# Patient Record
Sex: Male | Born: 1990
Health system: Southern US, Community
[De-identification: ages and names within clinical notes are randomized; demographics above are authoritative.]

## PROBLEM LIST (undated history)

## (undated) DIAGNOSIS — IMO0001 Reserved for inherently not codable concepts without codable children: Secondary | ICD-10-CM

## (undated) DIAGNOSIS — K219 Gastro-esophageal reflux disease without esophagitis: Secondary | ICD-10-CM

---
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
## 2009-12-28 ENCOUNTER — Emergency Department (HOSPITAL_COMMUNITY): Admission: EM | Admit: 2009-12-28 | Discharge: 2009-12-28 | Payer: Self-pay | Admitting: Emergency Medicine

## 2010-08-14 ENCOUNTER — Emergency Department (HOSPITAL_BASED_OUTPATIENT_CLINIC_OR_DEPARTMENT_OTHER): Admission: EM | Admit: 2010-08-14 | Discharge: 2010-08-14 | Payer: Self-pay | Admitting: Emergency Medicine

## 2011-03-16 ENCOUNTER — Emergency Department (HOSPITAL_BASED_OUTPATIENT_CLINIC_OR_DEPARTMENT_OTHER)
Admission: EM | Admit: 2011-03-16 | Discharge: 2011-03-16 | Disposition: A | Discharge status: Home / Self Care | Payer: Self-pay | Attending: Emergency Medicine | Admitting: Emergency Medicine

## 2011-03-16 DIAGNOSIS — R197 Diarrhea, unspecified: Secondary | ICD-10-CM | POA: Insufficient documentation

## 2011-03-16 DIAGNOSIS — F17200 Nicotine dependence, unspecified, uncomplicated: Secondary | ICD-10-CM | POA: Insufficient documentation

## 2011-03-16 DIAGNOSIS — R112 Nausea with vomiting, unspecified: Secondary | ICD-10-CM | POA: Insufficient documentation

## 2013-03-20 ENCOUNTER — Encounter (HOSPITAL_BASED_OUTPATIENT_CLINIC_OR_DEPARTMENT_OTHER): Payer: Self-pay | Admitting: Family Medicine

## 2013-03-20 ENCOUNTER — Emergency Department (HOSPITAL_BASED_OUTPATIENT_CLINIC_OR_DEPARTMENT_OTHER)
Admission: EM | Admit: 2013-03-20 | Discharge: 2013-03-20 | Disposition: A | Discharge status: Home / Self Care | Payer: Self-pay | Attending: Emergency Medicine | Admitting: Emergency Medicine

## 2013-03-20 DIAGNOSIS — K219 Gastro-esophageal reflux disease without esophagitis: Secondary | ICD-10-CM | POA: Insufficient documentation

## 2013-03-20 DIAGNOSIS — F17200 Nicotine dependence, unspecified, uncomplicated: Secondary | ICD-10-CM | POA: Insufficient documentation

## 2013-03-20 HISTORY — DX: Reserved for inherently not codable concepts without codable children: IMO0001

## 2013-03-20 HISTORY — DX: Gastro-esophageal reflux disease without esophagitis: K21.9

## 2013-03-20 LAB — CBC WITH DIFFERENTIAL/PLATELET
Eosinophils Absolute: 0.3 10*3/uL (ref 0.0–0.7)
Eosinophils Relative: 4 % (ref 0–5)
HCT: 44.2 % (ref 39.0–52.0)
MCH: 31.4 pg (ref 26.0–34.0)
MCHC: 35.7 g/dL (ref 30.0–36.0)
Monocytes Relative: 11 % (ref 3–12)
Neutro Abs: 4 10*3/uL (ref 1.7–7.7)
Platelets: 205 10*3/uL (ref 150–400)
RBC: 5.03 MIL/uL (ref 4.22–5.81)
RDW: 12.9 % (ref 11.5–15.5)

## 2013-03-20 LAB — COMPREHENSIVE METABOLIC PANEL
AST: 23 U/L (ref 0–37)
BUN: 11 mg/dL (ref 6–23)
CO2: 26 mEq/L (ref 19–32)
Chloride: 102 mEq/L (ref 96–112)
Creatinine, Ser: 0.9 mg/dL (ref 0.50–1.35)
GFR calc Af Amer: >90 mL/min (ref >90)
Glucose, Bld: 104 mg/dL — ABNORMAL HIGH (ref 70–99)
Potassium: 3.8 mEq/L (ref 3.5–5.1)
Total Protein: 6.4 g/dL (ref 6.0–8.3)

## 2013-03-20 LAB — PATHOLOGIST SMEAR REVIEW: Path Review: REACTIVE

## 2013-03-20 MED ORDER — ONDANSETRON 8 MG PO TBDP
8.0000 mg | ORAL_TABLET | Freq: Once | ORAL | Status: AC
  Administered 2013-03-20: 8 mg via ORAL
  Filled 2013-03-20: qty 1

## 2013-03-20 MED ORDER — PROMETHAZINE HCL 25 MG PO TABS: 25.0000 mg | ORAL_TABLET | Freq: Four times a day (QID) | ORAL | Status: DC | PRN

## 2013-03-20 NOTE — ED Provider Notes (Signed)
History     CSN: [id number]  Arrival date & time 03/20/13  0705   First MD Initiated Contact with Patient 03/20/13 0725      Chief Complaint  Patient presents with  . Nausea    (Consider location/radiation/quality/duration/timing/severity/associated sxs/prior treatment) HPI Comments: Patient presents with complaints of a one week history of feeling nauseated in the mornings and when he tries to eat.  He denies any pain, fever, or dark or bloody stool.  He sometimes wakes with a burning in his mouth.  He has been taking tums and drinking mild with some relief.  No prior surgeries.  Does report a history of reflux.    The history is provided by the patient.    Past Medical History  Diagnosis Date  . Reflux     History reviewed. No pertinent past surgical history.  No family history on file.  History  Substance Use Topics  . Smoking status: Current Every Day Smoker  . Smokeless tobacco: Not on file  . Alcohol Use: Yes      Review of Systems  All other systems reviewed and are negative.    Allergies  Review of patient's allergies indicates no known allergies.  Home Medications  No current outpatient prescriptions on file.  BP 129/85  Pulse 68  Temp(Src) 97.9 F (36.6 C) (Oral)  Resp 16  Ht 6\' 4"  (1.93 m)  Wt 170 lb (77.111 kg)  BMI 20.7 kg/m2  SpO2 100%  Physical Exam  Nursing note and vitals reviewed. Constitutional: He is oriented to person, place, and time. He appears well-developed and well-nourished. No distress.  HENT:  Head: Normocephalic and atraumatic.  Mouth/Throat: Oropharynx is clear and moist.  Neck: Normal range of motion. Neck supple.  Cardiovascular: Normal rate and regular rhythm.   No murmur heard. Pulmonary/Chest: Effort normal and breath sounds normal. No respiratory distress. He has no wheezes.  Abdominal: Soft. Bowel sounds are normal. He exhibits no distension. There is no tenderness.  Musculoskeletal: Normal range of motion.   Lymphadenopathy:    He has no cervical adenopathy.  Neurological: He is alert and oriented to person, place, and time.  Skin: Skin is warm and dry. He is not diaphoretic.    ED Course  Procedures (including critical care time)  Labs Reviewed  CBC WITH DIFFERENTIAL  COMPREHENSIVE METABOLIC PANEL   No results found.   No diagnosis found.    MDM  The labs are unremarkable and the patient appears well.  Suspect GERD, will treat with prilosec and phenergan.  Doubt cholecystits or other acute pathology.  Follow up as needed for any problems or not improving.          Geoffery Lyons, MD 03/20/13 [phone]

## 2013-03-20 NOTE — ED Notes (Signed)
Pt c/o nausea and intermittent vomiting x 1 wk. Pt reports h/o reflux.

## 2014-01-03 ENCOUNTER — Encounter (HOSPITAL_BASED_OUTPATIENT_CLINIC_OR_DEPARTMENT_OTHER): Payer: Self-pay | Admitting: Emergency Medicine

## 2014-01-03 ENCOUNTER — Emergency Department (HOSPITAL_BASED_OUTPATIENT_CLINIC_OR_DEPARTMENT_OTHER)
Admission: EM | Admit: 2014-01-03 | Discharge: 2014-01-03 | Disposition: A | Discharge status: Home / Self Care | Payer: Self-pay | Attending: Emergency Medicine | Admitting: Emergency Medicine

## 2014-01-03 DIAGNOSIS — R531 Weakness: Secondary | ICD-10-CM

## 2014-01-03 DIAGNOSIS — Z8719 Personal history of other diseases of the digestive system: Secondary | ICD-10-CM | POA: Insufficient documentation

## 2014-01-03 DIAGNOSIS — R5381 Other malaise: Secondary | ICD-10-CM | POA: Insufficient documentation

## 2014-01-03 DIAGNOSIS — F17200 Nicotine dependence, unspecified, uncomplicated: Secondary | ICD-10-CM | POA: Insufficient documentation

## 2014-01-03 DIAGNOSIS — R5383 Other fatigue: Secondary | ICD-10-CM

## 2014-01-03 LAB — BASIC METABOLIC PANEL
BUN: 14 mg/dL (ref 6–23)
CALCIUM: 9.6 mg/dL (ref 8.4–10.5)
CO2: 27 meq/L (ref 19–32)
CREATININE: 1 mg/dL (ref 0.50–1.35)
Chloride: 104 mEq/L (ref 96–112)
GFR calc Af Amer: >90 mL/min (ref >90)
Glucose, Bld: 122 mg/dL — ABNORMAL HIGH (ref 70–99)
Potassium: 4.2 mEq/L (ref 3.7–5.3)
Sodium: 142 mEq/L (ref 137–147)

## 2014-01-03 LAB — CBC
HEMATOCRIT: 44.3 % (ref 39.0–52.0)
Hemoglobin: 15 g/dL (ref 13.0–17.0)
MCH: 30.4 pg (ref 26.0–34.0)
MCHC: 33.9 g/dL (ref 30.0–36.0)
MCV: 89.9 fL (ref 78.0–100.0)
PLATELETS: 197 10*3/uL (ref 150–400)
RBC: 4.93 MIL/uL (ref 4.22–5.81)
RDW: 12.7 % (ref 11.5–15.5)
WBC: 9.2 10*3/uL (ref 4.0–10.5)

## 2014-01-03 NOTE — ED Provider Notes (Signed)
CSN: [id number]     Arrival date & time 01/03/14  0905 History   First MD Initiated Contact with Patient 01/03/14 [phone]     Chief Complaint  Patient presents with  . dizzy and fatigue      (Consider location/radiation/quality/duration/timing/severity/associated sxs/prior Treatment) HPI Comments: 57M presents with multiple complaints. His CT scan feeling drained, mentally and physically, for the past week. He denies any fevers, cough, chest pain, belly pain, vomiting, diarrhea. He stated last night he is looking at his phone and looked away and the like his eyes are spinning in circles. He stated some mild dizziness at that time. He then was able to sleep. He reports sleeping well, but he only eats one meal a day normally. 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 History reports occasional alcohol use, roughly every other day. He also smokes marijuana roughly every other day. He states "I don't feel like I'm sick", but he does not understand why he is so tired. He reports intermittent nausea throughout the day without vomiting every day to every other day for the past year.   Patient is a 23 y.o. male presenting with weakness. The history is provided by the patient.  Weakness This is a new problem. The current episode started more than 2 days ago (about 1 week). The problem occurs constantly. The problem has not changed since onset.Pertinent negatives include no chest pain, no abdominal pain, no headaches and no shortness of breath. Nothing aggravates the symptoms. Nothing relieves the symptoms. He has tried nothing for the symptoms. The treatment provided no relief.    Past Medical History  Diagnosis Date  . Reflux    History reviewed. No pertinent past surgical history. History reviewed. No pertinent family history. History  Substance Use Topics  . Smoking status: Current Every Day Smoker  . Smokeless tobacco: Not on file  . Alcohol Use: Yes    Review of Systems   Constitutional: Negative for fever, chills and unexpected weight change.  Respiratory: Negative for shortness of breath.   Cardiovascular: Negative for chest pain.  Gastrointestinal: Negative for abdominal pain.  Neurological: Positive for weakness. Negative for headaches.  All other systems reviewed and are negative.      Allergies  Review of patient's allergies indicates no known allergies.  Home Medications   Current Outpatient Rx  Name  Route  Sig  Dispense  Refill  . promethazine (PHENERGAN) 25 MG tablet   Oral   Take 1 tablet (25 mg total) by mouth every 6 (six) hours as needed for nausea.   12 tablet   1    BP 130/68  Pulse 66  Temp(Src) 97.7 F (36.5 C) (Oral)  Resp 16  Ht 6\' 3"  (1.905 m)  Wt 177 lb (80.287 kg)  BMI 22.12 kg/m2  SpO2 100% Physical Exam  Nursing note and vitals reviewed. Constitutional: He is oriented to person, place, and time. He appears well-developed and well-nourished. No distress.  HENT:  Head: Normocephalic and atraumatic.  Mouth/Throat: No oropharyngeal exudate.  Eyes: EOM are normal. Pupils are equal, round, and reactive to light.  Neck: Normal range of motion. Neck supple.  Cardiovascular: Normal rate and regular rhythm.  Exam reveals no friction rub.   No murmur heard. Pulmonary/Chest: Effort normal and breath sounds normal. No respiratory distress. He has no wheezes. He has no rales.  Abdominal: He exhibits no distension. There is no tenderness. There is no rebound.  Musculoskeletal: Normal range of motion.  He exhibits no edema.  Neurological: He is alert and oriented to person, place, and time.  Skin: He is not diaphoretic.    ED Course  Procedures (including critical care time) Labs Review Labs Reviewed  BASIC METABOLIC PANEL - Abnormal; Notable for the following:    Glucose, Bld 122 (*)    All other components within normal limits  CBC   Imaging Review No results found.  EKG Interpretation   None       MDM    Final diagnoses:  Weakness  Fatigue    23 year old male presents with weakness. It could be related to poor eating habits and/or drug/alcohol use. I believe his dizziness that was related to looking at his phone prior to going to sleep and focusing on a bright screen in a dimly lit room. He has not had any other recurrences this since it happened. His vitals are stable, and his exam is benign at this time. I explained to him he needs PCP followup for further testing. CBC and BMP are normal here. I do not feel he needs further imaging as he has no other systemic symptoms. Patient stable for discharge, given resource guide for followup.    Dagmar HaitWilliam Osiel Stick, MD 01/03/14 1018

## 2014-01-03 NOTE — Discharge Instructions (Signed)
Fatigue °Fatigue is a feeling of tiredness, lack of energy, lack of motivation, or feeling tired all the time. Having enough rest, good nutrition, and reducing stress will normally reduce fatigue. Consult your caregiver if it persists. The nature of your fatigue will help your caregiver to find out its cause. The treatment is based on the cause.  °CAUSES  °There are many causes for fatigue. Most of the time, fatigue can be traced to one or more of your habits or routines. Most causes fit into one or more of three general areas. They are: °Lifestyle problems °· Sleep disturbances. °· Overwork. °· Physical exertion. °· Unhealthy habits. °· Poor eating habits or eating disorders. °· Alcohol and/or drug use . °· Lack of proper nutrition (malnutrition). °Psychological problems °· Stress and/or anxiety problems. °· Depression. °· Grief. °· Boredom. °Medical Problems or Conditions °· Anemia. °· Pregnancy. °· Thyroid gland problems. °· Recovery from major surgery. °· Continuous pain. °· Emphysema or asthma that is not well controlled °· Allergic conditions. °· Diabetes. °· Infections (such as mononucleosis). °· Obesity. °· Sleep disorders, such as sleep apnea. °· Heart failure or other heart-related problems. °· Cancer. °· Kidney disease. °· Liver disease. °· Effects of certain medicines such as antihistamines, cough and cold remedies, prescription pain medicines, heart and blood pressure medicines, drugs used for treatment of cancer, and some antidepressants. °SYMPTOMS  °The symptoms of fatigue include:  °· Lack of energy. °· Lack of drive (motivation). °· Drowsiness. °· Feeling of indifference to the surroundings. °DIAGNOSIS  °The details of how you feel help guide your caregiver in finding out what is causing the fatigue. You will be asked about your present and past health condition. It is important to review all medicines that you take, including prescription and non-prescription items. A thorough exam will be done.  You will be questioned about your feelings, habits, and normal lifestyle. Your caregiver may suggest blood tests, urine tests, or other tests to look for common medical causes of fatigue.  °TREATMENT  °Fatigue is treated by correcting the underlying cause. For example, if you have continuous pain or depression, treating these causes will improve how you feel. Similarly, adjusting the dose of certain medicines will help in reducing fatigue.  °HOME CARE INSTRUCTIONS  °· Try to get the required amount of good sleep every night. °· Eat a healthy and nutritious diet, and drink enough water throughout the day. °· Practice ways of relaxing (including yoga or meditation). °· Exercise regularly. °· Make plans to change situations that cause stress. Act on those plans so that stresses decrease over time. Keep your work and personal routine reasonable. °· Avoid street drugs and minimize use of alcohol. °· Start taking a daily multivitamin after consulting your caregiver. °SEEK MEDICAL CARE IF:  °· You have persistent tiredness, which cannot be accounted for. °· You have fever. °· You have unintentional weight loss. °· You have headaches. °· You have disturbed sleep throughout the night. °· You are feeling sad. °· You have constipation. °· You have dry skin. °· You have gained weight. °· You are taking any new or different medicines that you suspect are causing fatigue. °· You are unable to sleep at night. °· You develop any unusual swelling of your legs or other parts of your body. °SEEK IMMEDIATE MEDICAL CARE IF:  °· You are feeling confused. °· Your vision is blurred. °· You feel faint or pass out. °· You develop severe headache. °· You develop severe abdominal, pelvic, or   back pain. °· You develop chest pain, shortness of breath, or an irregular or fast heartbeat. °· You are unable to pass a normal amount of urine. °· You develop abnormal bleeding such as bleeding from the rectum or you vomit blood. °· You have thoughts  about harming yourself or committing suicide. °· You are worried that you might harm someone else. °MAKE SURE YOU:  °· Understand these instructions. °· Will watch your condition. °· Will get help right away if you are not doing well or get worse. °Document Released: 08/23/2007 Document Revised: 01/18/2012 Document Reviewed: 08/23/2007 °ExitCare® Patient Information ©2014 [employer]. ° ° ° °Emergency Department Resource Guide °1) Find a Doctor and Pay Out of Pocket °Although you won't have to find out who is covered by your insurance plan, it is a good idea to ask around and get recommendations. You will then need to call the office and see if the doctor you have chosen will accept you as a new patient and what types of options they offer for patients who are self-pay. Some doctors offer discounts or will set up payment plans for their patients who do not have insurance, but you will need to ask so you aren't surprised when you get to your appointment. ° °2) Contact Your Local Health Department °Not all health departments have doctors that can see patients for sick visits, but many do, so it is worth a call to see if yours does. If you don't know where your local health department is, you can check in your phone book. The CDC also has a tool to help you locate your state's health department, and many state websites also have listings of all of their local health departments. ° °3) Find a Walk-in Clinic °If your illness is not likely to be very severe or complicated, you may want to try a walk in clinic. These are popping up all over the country in pharmacies, drugstores, and shopping centers. They're usually staffed by nurse practitioners or physician assistants that have been trained to treat common illnesses and complaints. They're usually fairly quick and inexpensive. However, if you have serious medical issues or chronic medical problems, these are probably not your best option. ° °No Primary Care  Doctor: °- Call [facility] at  832-8000 - they can help you locate a primary care doctor that  accepts your insurance, provides certain services, etc. °- Physician Referral Service- 1-800-533-3463 ° °Chronic Pain Problems: °Organization         Address  Phone   Notes  °[facility]  (336) 297-2271 Patients need to be referred by their primary care doctor.  ° °Medication Assistance: °Organization         Address  Phone   Notes  °[place] [facility] [address] °[place], [place] [address] (336) 641-8030 --Must be a resident of [place] °-- Must have NO insurance coverage whatsoever (no Medicaid/ Medicare, etc.) °-- The pt. MUST have a primary care doctor that directs their care regularly and follows them in the community °  °[facility]  (866) 331-1348   °[employer]  (888) 892-1162   ° °Agencies that provide inexpensive medical care: °Organization         Address  Phone   Notes  °[facility]  (336) 832-8035   °[facility]    (336) 832-7272   °[facility] [address] °[place], [place] [address] (336) 832-4777   °  [facility] [address], °[place] (336) 271-4999   °[facility]    (336) 373-0678   °[facility]    (336) 272-1050   °[facility] ° [address], [place] Phone:  (336) 832-4444, Fax:  (336) 832-4440 Hours of Operation:  9 am - 6 pm, M-F.  Also accepts Medicaid/Medicare and self-pay.  °[facility] ° [address], [place] Phone: (336) 832-3150, Fax: (336) 832-3151. Hours of Operation:  8:30 am - 5:30 pm, M-F.  Also accepts Medicaid and self-pay.  °[facility] [place] [address], [place] Phone: (336) 878-6027   °[facility] [address], [place], [place] (336)723-1848, Ext. 123 Mondays & Thursdays: 7-9 AM.  First 15 patients are seen on a first  come, first serve basis. °  ° °Medicaid-accepting [place] Providers: ° °Organization         Address  Phone   Notes  °[facility] [address], [place] (336) 641-2100 Also accepts self-pay patients.  °[facility] [address], [place] ° (336) 856-9996   °[facility] [address], [place] (336) 288-8857   °[facility] [address] [place] [address], [place] (336) 299-7000   °Veita Bland [address], [place]  ° (336) 373-1557 Only accepts  Access Medicaid patients after they have their name applied to their card.  ° °Self-Pay (no insurance) in [place]: ° °Organization         Address  Phone   Notes  °Sickle Cell Patients, [facility] [address], [place] (336) 832-1970   °[facility] [address], [place] (336) 832-4400   °[facility] ° [address], [place] (336) 992-4800   °[facility]/Dr. Osei-Bonsu ° [address] [place] [address], [place] or [address], [place] (336) [phone] Phone number for both [place] and [place] locations is the same.  °[facility] [address], [place] (336) 299-0000   °[facility] [address] [place] [address], [place] or [address] (336) 852-7530 °(336) 878-2260   °[facility] [address],  (336) 350-1642, phone; (336) 294-5005, fax Sees patients 1st and 3rd Saturday of every month.  Must not qualify for public or private insurance (i.e. Medicaid, Medicare, [place] Health Choice, Veterans' Benefits) • Household income should be no more than 200% of the poverty level •The clinic cannot treat you if you are pregnant or think you are pregnant • Sexually transmitted diseases are not treated at the clinic.  ° ° °Dental Care: °Organization          Address  Phone  Notes  °[facility] [address],  (336) 641-6152 Accepts children up to age 21 who are enrolled in Medicaid or [place] Health Choice; pregnant women with a Medicaid card; and children who have applied for Medicaid or [place] Health Choice, but were declined, whose parents can pay a reduced fee at time of service.  °[facility] [place]  [address], [place] (336) 641-7733 Accepts children up to age 21 who are enrolled in Medicaid or [place] Health Choice; pregnant women with a Medicaid card; and   children who have applied for Medicaid or [place] Health Choice, but were declined, whose parents can pay a reduced fee at time of service.  °[facility] ° [address], [place] (336) 641-4533 Patients are seen by appointment only. Walk-ins are not accepted. [facility] will see patients 18 years of age and older. °Monday - Tuesday (8am-5pm) °Most Wednesdays (8:30-5pm) °$30 per visit, cash only  °[facility] ° [address], [place] (336) 641-4533 Patients are seen by appointment only. Walk-ins are not accepted. [facility] will see patients 18 years of age and older. °One Wednesday Evening (Monthly: Volunteer Based).  $30 per visit, cash only  °[facility]  (919) 537-3737 for adults; Children under age 4, call Graduate Pediatric Dentistry at (919) [phone]. Children aged 4-14, please call (919) [phone] to request a pediatric application. ° Dental services are provided in all areas of dental care including fillings, crowns and bridges, complete and partial dentures, implants, gum treatment, root canals, and extractions. Preventive care is also provided. Treatment is provided to both adults and children. °Patients are selected via a lottery and there is often a waiting list. °  °[facility] [address], °[place] ° (336) 763-8833 [email or web address] °  °[facility] [address], [place], [place] (336)723-1848, Ext. 123 Second and Fourth Thursday of each month, opens at 6:30 AM; Clinic ends at 9 AM.  Patients are seen on a first-come first-served basis, and a limited number are seen during each clinic.  ° °[facility] ° [address], [place], [place] (336) 723-7904   Eligibility Requirements °You must have lived in [place], [place], or [place] counties for at least the last three months. °  You cannot be eligible for state or federal sponsored healthcare insurance, including Veterans Administration, Medicaid, or Medicare. °  You generally cannot be eligible for healthcare insurance through your employer.  °  How to apply: °Eligibility screenings are held every Tuesday and Wednesday afternoon from 1:00 pm until 4:00 pm. You do not need an appointment for the interview!  °[facility] [address], [place], [place] 336-631-2330   °[facility]  336-342-8273   °[facility]  336-703-3100   °[facility]  336-570-6415   ° °Behavioral Health Resources in the Community: °Intensive Outpatient Programs °Organization         Address  Phone  Notes  °[facility] [address], [place], [place] 336-878-6098   °[facility] [address], [place], [place] 336-832-9800   °[facility] [address], [place], [place] ° 336-882-2125   °[facility] [address],  °[place], [place] 1-800-853-5163 or 336-641-4981   °Substance Abuse Resources °Organization         Address  Phone  Notes  °[facility]  336-882-2125   °[facility]  336-784-9470   °[facility]  336-285-9073   °[facility]  336-845-3988   °Residential & Outpatient Substance Abuse Program  1-800-659-3381   °Psychological  Services °Organization         Address  Phone  Notes  °[facility]  336- 832-9600   °[facility]  336- 378-7881   °[facility] [address], [place] 1-800-853-5163 or 336-641-4981   ° °Mobile Crisis Teams °Organization           Address  Phone  Notes  °[facility], Mobile Crisis Care Unit  1-877-626-1772   °Assertive °[facility] ° [address] [place], [place] 336-834-9664   °Sharon DeEsch [address] °[place] 336-554-5454   ° °Self-Help/Support Groups °Organization         Address  Phone             Notes  °[facility] - variety of support groups  336- 373-1402 Call for more information  °Narcotics Anonymous (NA), [facility] [address], °[place]  2 meetings at this location  ° °Residential Treatment Programs °Organization         Address  Phone  Notes  °[facility] [address],    °[place]  1-866-801-8205   °[facility] ° [address], [place], [place] 704-293-8524   °[facility] [address], [place] 336-845-3988 Admissions: 8am-3pm M-F  °[facility] [address]    °[place], [place] 336-841-1104   °[facility] [address], [place], [place] 336-379-7146   °[facility] [address]  °[place], [place] 336-285-9073   °[facility] - Intensive Outpatient [address], [place], [place] 336-852-3033   °[facility] [address]  °[place], [place] 1-877-615-2722 or 336-784-9470   °[facility] [address] [place], [place] 336-227-7417 Accepts Medicaid  °[facility] [address]  ° [place] 1-800-659-3381 Substance Abuse/Addiction Treatment  ° °[place] Behavioral Health Resources °Organization         Address  Phone  Notes  °[facility]  (888)  581-9988   °Julie Brannon, PhD [address] [place], [place]   (336) 349-5553 or (336) 951-0000   °[facility]   [address] °[place], [place] (336) 349-4454   °[facility] [address], [place], [place] (336) 342-8316 Insurance/Medicaid/sponsorship through [facility]  °[facility] [address]                                    [place], [place] (336) 342-8316 Therapy/tele-psych/case  °[facility] [address]  ° [place], [place] (336) 349-2233    °Dr. Arfeen  (336) 349-4544   °[facility] [place]  [employer] [place] [facility] 1) [address], [place] °2) [address], [place] °3)  [address], [place] (336) 349-3220 °(336) 342-7768 ° °(336) 342-8140   °[place] [facility] (336) 342-1394 or (336) 342-3537 (After Hours)    ° °  °

## 2014-01-03 NOTE — ED Notes (Signed)
Pt concerned that off and on for a year he has been having episodes of feeling mentally fatigued and dizzy . Pt does admit to drinking alcohol at least every other day and smokes marijuana. 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

## 2014-08-27 ENCOUNTER — Emergency Department (HOSPITAL_BASED_OUTPATIENT_CLINIC_OR_DEPARTMENT_OTHER)
Admission: EM | Admit: 2014-08-27 | Discharge: 2014-08-27 | Discharge status: Against Medical Advice | Payer: Self-pay | Attending: Emergency Medicine | Admitting: Emergency Medicine

## 2014-08-27 ENCOUNTER — Emergency Department (HOSPITAL_BASED_OUTPATIENT_CLINIC_OR_DEPARTMENT_OTHER): Payer: Self-pay

## 2014-08-27 ENCOUNTER — Encounter (HOSPITAL_BASED_OUTPATIENT_CLINIC_OR_DEPARTMENT_OTHER): Payer: Self-pay | Admitting: Emergency Medicine

## 2014-08-27 DIAGNOSIS — R05 Cough: Secondary | ICD-10-CM | POA: Insufficient documentation

## 2014-08-27 DIAGNOSIS — R0981 Nasal congestion: Secondary | ICD-10-CM | POA: Insufficient documentation

## 2014-08-27 DIAGNOSIS — Z720 Tobacco use: Secondary | ICD-10-CM | POA: Insufficient documentation

## 2014-08-27 NOTE — ED Notes (Signed)
Pt called to go back to a room, unable to find pt x2

## 2014-08-27 NOTE — ED Notes (Signed)
Reports "sick for 2 weeks"- c/o cough, congestion- no acute distress, resp even and unlabored

## 2014-08-27 NOTE — ED Notes (Signed)
Patient called from room x3, EMT walked around to cafe area and to pharmacy, patient not found.

## 2015-03-04 ENCOUNTER — Emergency Department (HOSPITAL_BASED_OUTPATIENT_CLINIC_OR_DEPARTMENT_OTHER)
Admission: EM | Admit: 2015-03-04 | Discharge: 2015-03-04 | Discharge status: Against Medical Advice | Payer: Self-pay | Attending: Emergency Medicine | Admitting: Emergency Medicine

## 2015-03-04 ENCOUNTER — Encounter (HOSPITAL_BASED_OUTPATIENT_CLINIC_OR_DEPARTMENT_OTHER): Payer: Self-pay | Admitting: *Deleted

## 2015-03-04 DIAGNOSIS — R079 Chest pain, unspecified: Secondary | ICD-10-CM | POA: Insufficient documentation

## 2015-03-04 DIAGNOSIS — Z720 Tobacco use: Secondary | ICD-10-CM | POA: Insufficient documentation

## 2015-03-04 NOTE — ED Notes (Signed)
No answer in lobby.

## 2015-03-04 NOTE — ED Notes (Signed)
Chest pain like a bad pulled muscle. Worse with movement.

## 2017-02-01 ENCOUNTER — Encounter (HOSPITAL_BASED_OUTPATIENT_CLINIC_OR_DEPARTMENT_OTHER): Payer: Self-pay | Admitting: Emergency Medicine

## 2017-02-01 ENCOUNTER — Emergency Department (HOSPITAL_BASED_OUTPATIENT_CLINIC_OR_DEPARTMENT_OTHER)
Admission: EM | Admit: 2017-02-01 | Discharge: 2017-02-01 | Disposition: A | Discharge status: Home / Self Care | Payer: Self-pay | Attending: Emergency Medicine | Admitting: Emergency Medicine

## 2017-02-01 DIAGNOSIS — Y929 Unspecified place or not applicable: Secondary | ICD-10-CM | POA: Insufficient documentation

## 2017-02-01 DIAGNOSIS — Y999 Unspecified external cause status: Secondary | ICD-10-CM | POA: Insufficient documentation

## 2017-02-01 DIAGNOSIS — Y939 Activity, unspecified: Secondary | ICD-10-CM | POA: Insufficient documentation

## 2017-02-01 DIAGNOSIS — S29012A Strain of muscle and tendon of back wall of thorax, initial encounter: Secondary | ICD-10-CM | POA: Insufficient documentation

## 2017-02-01 DIAGNOSIS — M546 Pain in thoracic spine: Secondary | ICD-10-CM

## 2017-02-01 DIAGNOSIS — T148XXA Other injury of unspecified body region, initial encounter: Secondary | ICD-10-CM

## 2017-02-01 DIAGNOSIS — F17210 Nicotine dependence, cigarettes, uncomplicated: Secondary | ICD-10-CM | POA: Insufficient documentation

## 2017-02-01 DIAGNOSIS — X58XXXA Exposure to other specified factors, initial encounter: Secondary | ICD-10-CM | POA: Insufficient documentation

## 2017-02-01 MED ORDER — IBUPROFEN 800 MG PO TABS: 800.0000 mg | ORAL_TABLET | Freq: Four times a day (QID) | ORAL | 0 refills | Status: DC | PRN

## 2017-02-01 MED ORDER — METHOCARBAMOL 500 MG PO TABS: 500.0000 mg | ORAL_TABLET | Freq: Four times a day (QID) | ORAL | 0 refills | Status: DC | PRN

## 2017-02-01 NOTE — ED Triage Notes (Signed)
States," My back is hurting me" Recurrent episodes of back pain for years, was using a ax last week and has pain to left scapula and cervical area

## 2017-02-01 NOTE — Discharge Instructions (Signed)
You have been seen in the Emergency Department (ED)  today for back pain.  Your workup and exam have not shown any acute abnormalities and you are likely suffering from muscle strain or possible problems with your discs, but there is no treatment that will fix your symptoms at this time.  Please take Motrin (ibuprofen) as needed for your pain according to the instructions written on the box.  Alternatively, for the next five days you can take 600mg three times daily with meals (it may upset your stomach). ° °Take Robaxin as prescribed for severe pain. Do not drink alcohol, drive or participate in any other potentially dangerous activities while taking this medication as it may make you sleepy. Do not take this medication with any other sedating medications, either prescription or over-the-counter. If you were prescribed Percocet or Vicodin, do not take these with acetaminophen (Tylenol) as it is already contained within these medications. ° ° °Please follow up with your doctor as soon as possible regarding today's ED visit and your back pain.  Return to the ED for worsening back pain, fever, weakness or numbness of either leg, or if you develop either (1) an inability to urinate or have bowel movements, or (2) loss of your ability to control your bathroom functions (if you start having "accidents"), or if you develop other new symptoms that concern you. ° °

## 2017-02-01 NOTE — ED Provider Notes (Signed)
Emergency Department Provider Note   I have reviewed the triage vital signs and the nursing notes.   HISTORY  Chief Complaint Back Pain   HPI Nicholas Kaiser is a 26 y.o. male with PMH of reflux presents to the emergency department for evaluation of left-sided mid back pain in the setting of chopping wood yesterday. Patient states that shortly after chopping wasn't especially this morning he had severe pain in his back. He describes pain mostly on the left side of his thoracic spine radiating downward slightly. No weakness or numbness. No chest pain or difficulty breathing. Pain is worse with movement or palpation. Scribe's as a cramping pain that is moderate to severe. No alleviating factors.    Past Medical History:  Diagnosis Date  . Reflux     There are no active problems to display for this patient.   History reviewed. No pertinent surgical history.  Current Outpatient Rx  . Order #: [id number] Class: Print  . Order #: [id number] Class: Print  . Order #: [id number] Class: Print    Allergies Patient has no known allergies.  No family history on file.  Social History Social History  Substance Use Topics  . Smoking status: Current Every Day Smoker    Packs/day: 1.00    Types: Cigarettes  . Smokeless tobacco: Never Used  . Alcohol use Yes     Comment: socially    Review of Systems  Constitutional: No fever/chills Eyes: No visual changes. ENT: No sore throat. Cardiovascular: Denies chest pain. Respiratory: Denies shortness of breath. Gastrointestinal: No abdominal pain.  No nausea, no vomiting.  No diarrhea.  No constipation. Genitourinary: Negative for dysuria. Musculoskeletal: Positive thoracic back pain.  Skin: Negative for rash. Neurological: Negative for headaches, focal weakness or numbness.  10-point ROS otherwise negative.  ____________________________________________   PHYSICAL EXAM:  VITAL SIGNS: ED Triage Vitals  Enc Vitals Group     BP  02/01/17 0700 (!) 134/116     Pulse Rate 02/01/17 0700 62     Resp 02/01/17 0700 18     Temp 02/01/17 0700 97.5 F (36.4 C)     Temp Source 02/01/17 0700 Oral     SpO2 02/01/17 0700 98 %     Weight 02/01/17 0700 177 lb (80.3 kg)     Height 02/01/17 0700 6\' 3"  (1.905 m)     Pain Score 02/01/17 0713 4   Constitutional: Alert and oriented. Well appearing and in no acute distress. Eyes: Conjunctivae are normal.  Head: Atraumatic. Nose: No congestion/rhinnorhea. Mouth/Throat: Mucous membranes are moist.  Oropharynx non-erythematous. Neck: No stridor.   Cardiovascular: Normal rate, regular rhythm. Good peripheral circulation. Grossly normal heart sounds.   Respiratory: Normal respiratory effort.  No retractions. Lungs CTAB. Gastrointestinal: Soft and nontender. No distention.  Musculoskeletal: No lower extremity tenderness nor edema. No gross deformities of extremities. Tenderness to palpation over the left thoracic paraspinal musculature. No bruising. No midline spine tenderness.  Neurologic:  Normal speech and language. No gross focal neurologic deficits are appreciated.  Skin:  Skin is warm, dry and intact. No rash noted. Psychiatric: Mood and affect are normal. Speech and behavior are normal.  ____________________________________________   PROCEDURES  Procedure(s) performed:   Procedures  None ____________________________________________   INITIAL IMPRESSION / ASSESSMENT AND PLAN / ED COURSE  Pertinent labs & imaging results that were available during my care of the patient were reviewed by me and considered in my medical decision making (see chart for details).  Patient presents to the emergency  department for evaluation of thoracic back pain in the setting of chopping wood yesterday. No numbness or weakness. No midline spine tenderness. No chest pain. Suspect musculoskeletal etiology with tenderness to palpation of the area history of chopping wood yesterday with pain  starting afterwards. With no focal neurological deficits or concern for cardiovascular disease plan for symptomatic treatment with NSAIDs and muscle relaxer. We'll give work note and advised continued activity/movement.   At this time, I do not feel there is any life-threatening condition present. I have reviewed and discussed all results (EKG, imaging, lab, urine as appropriate), exam findings with patient. I have reviewed nursing notes and appropriate previous records.  I feel the patient is safe to be discharged home without further emergent workup. Discussed usual and customary return precautions. Patient and family (if present) verbalize understanding and are comfortable with this plan.  Patient will follow-up with their primary care provider. If they do not have a primary care provider, information for follow-up has been provided to them. All questions have been answered.  ____________________________________________  FINAL CLINICAL IMPRESSION(S) / ED DIAGNOSES  Final diagnoses:  Muscle strain  Acute left-sided thoracic back pain     MEDICATIONS GIVEN DURING THIS VISIT:  None  NEW OUTPATIENT MEDICATIONS STARTED DURING THIS VISIT:  Discharge Medication List as of 02/01/2017  7:31 AM    START taking these medications   Details  ibuprofen (ADVIL,MOTRIN) 800 MG tablet Take 1 tablet (800 mg total) by mouth every 6 (six) hours as needed., Starting Mon 02/01/2017, Print    methocarbamol (ROBAXIN) 500 MG tablet Take 1 tablet (500 mg total) by mouth every 6 (six) hours as needed for muscle spasms., Starting Mon 02/01/2017, Print          Note:  This document was prepared using Dragon voice recognition software and may include unintentional dictation errors.  Alona BeneJoshua Analysse Quinonez, MD Emergency Medicine   Maia PlanJoshua G Toivo Bordon, MD 02/01/17 [phone]

## 2017-02-01 NOTE — ED Notes (Signed)
Work note given. Pt directed to pharmacy to pick up Rx 

## 2017-03-28 ENCOUNTER — Emergency Department (HOSPITAL_BASED_OUTPATIENT_CLINIC_OR_DEPARTMENT_OTHER): Payer: Self-pay

## 2017-03-28 ENCOUNTER — Emergency Department (HOSPITAL_BASED_OUTPATIENT_CLINIC_OR_DEPARTMENT_OTHER)
Admission: EM | Admit: 2017-03-28 | Discharge: 2017-03-28 | Disposition: A | Discharge status: Home / Self Care | Payer: Self-pay | Attending: Emergency Medicine | Admitting: Emergency Medicine

## 2017-03-28 ENCOUNTER — Encounter (HOSPITAL_BASED_OUTPATIENT_CLINIC_OR_DEPARTMENT_OTHER): Payer: Self-pay | Admitting: *Deleted

## 2017-03-28 DIAGNOSIS — R0789 Other chest pain: Secondary | ICD-10-CM

## 2017-03-28 DIAGNOSIS — R002 Palpitations: Secondary | ICD-10-CM | POA: Insufficient documentation

## 2017-03-28 DIAGNOSIS — R079 Chest pain, unspecified: Secondary | ICD-10-CM | POA: Insufficient documentation

## 2017-03-28 DIAGNOSIS — F17210 Nicotine dependence, cigarettes, uncomplicated: Secondary | ICD-10-CM | POA: Insufficient documentation

## 2017-03-28 LAB — BASIC METABOLIC PANEL
Anion gap: 8 (ref 5–15)
BUN: 15 mg/dL (ref 6–20)
CHLORIDE: 105 mmol/L (ref 101–111)
CO2: 25 mmol/L (ref 22–32)
Calcium: 8.9 mg/dL (ref 8.9–10.3)
Creatinine, Ser: 0.86 mg/dL (ref 0.61–1.24)
GFR calc Af Amer: >60 mL/min (ref >60)
GLUCOSE: 103 mg/dL — AB (ref 65–99)
POTASSIUM: 3.6 mmol/L (ref 3.5–5.1)
Sodium: 138 mmol/L (ref 135–145)

## 2017-03-28 LAB — CBC
HEMATOCRIT: 42.4 % (ref 39.0–52.0)
Hemoglobin: 15.4 g/dL (ref 13.0–17.0)
MCH: 32.2 pg (ref 26.0–34.0)
MCHC: 36.3 g/dL — AB (ref 30.0–36.0)
MCV: 88.5 fL (ref 78.0–100.0)
Platelets: 228 10*3/uL (ref 150–400)
RBC: 4.79 MIL/uL (ref 4.22–5.81)
RDW: 12.8 % (ref 11.5–15.5)
WBC: 8.7 10*3/uL (ref 4.0–10.5)

## 2017-03-28 LAB — TROPONIN I: Troponin I: <0.03 ng/mL (ref <0.03)

## 2017-03-28 NOTE — Discharge Instructions (Signed)
Return to the ED with any concerns including difficulty breathing, worsening chest pain, fainting, leg swelling, decreased level of alertness/lethargy, or any other alarming symptoms

## 2017-03-28 NOTE — ED Triage Notes (Signed)
Pt is here for re-evaluation of CP that began over a month ago.  Pt was worked up for this at [facility] and the results were all negative.  Pt states that the pain comes and goes and is like a pinching and a pressure in his chest. Pt states that he sometimes feels light headed when this occurs and at times he wonders if it could be anxiety.

## 2017-03-28 NOTE — ED Notes (Signed)
ED Provider at bedside. 

## 2017-03-28 NOTE — ED Provider Notes (Signed)
[facility]   CSN: [id number] Arrival date & time: 03/28/17  1939  By signing my name below, I, Modena Jansky, attest that this documentation has been prepared under the direction and in the presence of Jerelyn Scott, MD. Electronically Signed: Modena Jansky, Scribe. 03/28/2017. 9:29 PM.  History   Chief Complaint Chief Complaint  Patient presents with  . Chest Pain   The history is provided by the patient. No language interpreter was used.  Chest Pain   This is a new problem. The current episode started more than 1 week ago. The problem occurs daily. The problem has not changed since onset.The pain is associated with rest. The pain is present in the substernal region and lateral region. The pain is moderate. The quality of the pain is described as pressure-like. The pain does not radiate. Associated symptoms include palpitations. Pertinent negatives include no fever. He has tried nothing for the symptoms. Risk factors include smoking/tobacco exposure and male gender.  Pertinent negatives for family medical history include: no heart disease, no early MI and no sudden death.   HPI Comments: Nicholas Kaiser is a 26 y.o. male with a PMHx of reflux who presents to the Emergency Department complaining of intermittent moderate generalized chest pain that started about a month ago. He has been evaluated for the same complaint at [facility]. He states his pain and heart palpitations ("heart racing") come on while he at rest. His daily episodes last seconds or sometimes longer. He describes the pain as a pressure sensation. He admits to a hx of smoking. Denies any recent surgery/hospitalization, recent long trip, family hx of sudden cardiac death, or other complaints at this time.  He feels anxious with the pain and his hands get very clammy.  No nausea, no diaphoresis. No syncope.    Past Medical History:  Diagnosis Date  . Reflux     There are no active  problems to display for this patient.   History reviewed. No pertinent surgical history.     Home Medications    Prior to Admission medications   Medication Sig Start Date End Date Taking? Authorizing Provider  ibuprofen (ADVIL,MOTRIN) 800 MG tablet Take 1 tablet (800 mg total) by mouth every 6 (six) hours as needed. 02/01/17   Long, Arlyss Repress, MD  methocarbamol (ROBAXIN) 500 MG tablet Take 1 tablet (500 mg total) by mouth every 6 (six) hours as needed for muscle spasms. 02/01/17   Long, Arlyss Repress, MD  promethazine (PHENERGAN) 25 MG tablet Take 1 tablet (25 mg total) by mouth every 6 (six) hours as needed for nausea. 03/20/13   Geoffery Lyons, MD    Family History No family history on file.  Social History Social History  Substance Use Topics  . Smoking status: Current Every Day Smoker    Packs/day: 1.00    Types: Cigarettes  . Smokeless tobacco: Never Used  . Alcohol use Yes     Comment: socially     Allergies   Patient has no known allergies.   Review of Systems Review of Systems  Constitutional: Negative for fever.  Cardiovascular: Positive for chest pain and palpitations.  All other systems reviewed and are negative.    Physical Exam Updated Vital Signs BP (!) 144/86   Pulse 80   Temp 98.4 F (36.9 C) (Oral)   Resp 16   Wt 175 lb (79.4 kg)   SpO2 99%   BMI 21.87 kg/m  Vitals reviewed Physical Exam Physical  Examination: General appearance - alert, well appearing, and in no distress Mental status - alert, oriented to person, place, and time Eyes - no conjunctival injection, no scleral icterus Mouth - mucous membranes moist, pharynx normal without lesions Chest - clear to auscultation, no wheezes, rales or rhonchi, symmetric air entry Heart - normal rate, regular rhythm, normal S1, S2, no murmurs, rubs, clicks or gallops Abdomen - soft, nontender, nondistended, no masses or organomegaly Neurological - alert, oriented, normal speech Extremities - peripheral  pulses normal, no pedal edema, no clubbing or cyanosis Skin - normal coloration and turgor, no rashes  ED Treatments / Results  DIAGNOSTIC STUDIES: Oxygen Saturation is 99% on RA, normal by my interpretation.    COORDINATION OF CARE: 9:33 PM- Pt advised of plan for treatment and pt agrees.  Labs (all labs ordered are listed, but only abnormal results are displayed) Labs Reviewed  BASIC METABOLIC PANEL - Abnormal; Notable for the following:       Result Value   Glucose, Bld 103 (*)    All other components within normal limits  CBC - Abnormal; Notable for the following:    MCHC 36.3 (*)    All other components within normal limits  TROPONIN I    EKG  EKG Interpretation  Date/Time:  Sunday Mar 28 2017 19:47:26 EDT Ventricular Rate:  83 PR Interval:  166 QRS Duration: 96 QT Interval:  356 QTC Calculation: 418 R Axis:   88 Text Interpretation:  Normal sinus rhythm Normal ECG No significant change since last tracing Confirmed by Jerelyn ScottLinker, Lafe Clerk [phone]) on 03/28/2017 7:50:29 PM       Radiology Dg Chest 2 View  Result Date: 03/28/2017 CLINICAL DATA:  Chest pain beginning 1 month ago. EXAM: CHEST  2 VIEW COMPARISON:  08/27/2014 FINDINGS: The heart size and mediastinal contours are within normal limits. Both lungs are clear. The visualized skeletal structures are unremarkable. IMPRESSION: No active cardiopulmonary disease. Electronically Signed   By: Elberta Fortisaniel  Boyle M.D.   On: 03/28/2017 20:41    Procedures Procedures (including critical care time)  Medications Ordered in ED Medications - No data to display   Initial Impression / Assessment and Plan / ED Course  I have reviewed the triage vital signs and the nursing notes.  Pertinent labs & imaging results that were available during my care of the patient were reviewed by me and considered in my medical decision making (see chart for details).     Pt presenting with c/o ongoing chest pains over the past month associated  with fast heart rate and sweaty palms.  He has a lot of anxiety about his symptoms.  EKG and labs and CXR are reassuring.  He has a low heart score, PERC negative.  Doubt ACS and very low risk for PE.  Discussed the workup that was done today in the ED, recommended cardiology followup for palpitations.  Discussed that some of his symptoms are likley related to anxiety.  Given cardiology referral.  Discharged with strict return precautions.  Pt agreeable with plan.  Final Clinical Impressions(s) / ED Diagnoses   Final diagnoses:  Atypical chest pain  Palpitations    New Prescriptions Discharge Medication List as of 03/28/2017  9:46 PM     I personally performed the services described in this documentation, which was scribed in my presence. The recorded information has been reviewed and is accurate.      Jerelyn ScottLinker, Riti Rollyson, MD 03/31/17 [phone]

## 2017-09-11 ENCOUNTER — Encounter (HOSPITAL_BASED_OUTPATIENT_CLINIC_OR_DEPARTMENT_OTHER): Payer: Self-pay | Admitting: Emergency Medicine

## 2017-09-11 ENCOUNTER — Emergency Department (HOSPITAL_BASED_OUTPATIENT_CLINIC_OR_DEPARTMENT_OTHER)
Admission: EM | Admit: 2017-09-11 | Discharge: 2017-09-11 | Disposition: A | Discharge status: Home / Self Care | Payer: Self-pay | Attending: Emergency Medicine | Admitting: Emergency Medicine

## 2017-09-11 DIAGNOSIS — K029 Dental caries, unspecified: Secondary | ICD-10-CM | POA: Insufficient documentation

## 2017-09-11 DIAGNOSIS — F17210 Nicotine dependence, cigarettes, uncomplicated: Secondary | ICD-10-CM | POA: Insufficient documentation

## 2017-09-11 MED ORDER — BUPIVACAINE-EPINEPHRINE (PF) 0.5% -1:200000 IJ SOLN
1.8000 mL | Freq: Once | INTRAMUSCULAR | Status: AC
  Administered 2017-09-11: 1.8 mL
  Filled 2017-09-11: qty 1.8

## 2017-09-11 MED ORDER — HYDROCODONE-ACETAMINOPHEN 5-325 MG PO TABS: 1.0000 | ORAL_TABLET | ORAL | 0 refills | Status: AC | PRN

## 2017-09-11 MED ORDER — PENICILLIN V POTASSIUM 500 MG PO TABS: 500.0000 mg | ORAL_TABLET | Freq: Four times a day (QID) | ORAL | 0 refills | Status: AC

## 2017-09-11 MED ORDER — PENICILLIN V POTASSIUM 250 MG PO TABS
500.0000 mg | ORAL_TABLET | Freq: Once | ORAL | Status: AC
  Administered 2017-09-11: 500 mg via ORAL
  Filled 2017-09-11: qty 2

## 2017-09-11 NOTE — ED Triage Notes (Signed)
Pt c/o dental pain x 3 days.

## 2017-09-11 NOTE — ED Notes (Signed)
Dental pain left bottom back tooth x 3 days

## 2017-09-11 NOTE — ED Provider Notes (Addendum)
[facility] Provider Note: Lowella Dell, MD, FACEP  CSN: [id number] MRN: [id number] ARRIVAL: 09/11/17 at 0004 ROOM: MH02/MH02   CHIEF COMPLAINT  Dental Pain   HISTORY OF PRESENT ILLNESS  09/11/17 2:50 AM Nicholas Kaiser is a 26 y.o. male with multiple carious teeth.  He is here with a 3-day history of pain associated with his left lower second molar.  He states the pain is severe and he has been unable to sleep.  He has had no relief with ibuprofen, Aleve and/or Tylenol.  There is associated left anterior cervical lymphadenopathy.  Pain is worse with attempted eating or drinking.  Consultation with the [facility] state controlled substances database reveals the patient has received no opioid pain prescriptions in the past year.   Past Medical History:  Diagnosis Date  . Reflux     History reviewed. No pertinent surgical history.  No family history on file.  Social History  Substance Use Topics  . Smoking status: Current Every Day Smoker    Packs/day: 1.00    Types: Cigarettes  . Smokeless tobacco: Never Used  . Alcohol use Yes     Comment: socially    Prior to Admission medications   Not on File    Allergies Patient has no known allergies.   REVIEW OF SYSTEMS  Negative except as noted here or in the History of Present Illness.   PHYSICAL EXAMINATION  Initial Vital Signs Blood pressure (!) 157/96, pulse 82, temperature 97.9 F (36.6 C), temperature source Oral, resp. rate 16, height 6\' 3"  (1.905 m), weight 81.6 kg (180 lb), SpO2 99 %.  Examination General: Well-developed, well-nourished male in no acute distress; appearance consistent with age of record HENT: normocephalic; atraumatic; multiple carious teeth; severely carious left lower second molar with adjacent edema and erythema of the gum Eyes: pupils equal, round and reactive to light; extraocular muscles intact Neck: supple; left anterior cervical lymphadenopathy Heart: regular rate  and rhythm Lungs: clear to auscultation bilaterally Abdomen: soft; nondistended; nontender; bowel sounds present Extremities: No deformity; full range of motion Neurologic: Awake, alert and oriented; motor function intact in all extremities and symmetric; no facial droop Skin: Warm and dry Psychiatric: Appears uncomfortable   RESULTS  Summary of this visit's results, reviewed by myself:   EKG Interpretation  Date/Time:    Ventricular Rate:    PR Interval:    QRS Duration:   QT Interval:    QTC Calculation:   R Axis:     Text Interpretation:        Laboratory Studies: No results found for this or any previous visit (from the past 24 hour(s)). Imaging Studies: No results found.  ED COURSE  Nursing notes and initial vitals signs, including pulse oximetry, reviewed.  Vitals:   09/11/17 0013 09/11/17 0014  BP: (!) 157/96   Pulse: 82   Resp: 16   Temp: 97.9 F (36.6 C)   TempSrc: Oral   SpO2: 99%   Weight:  81.6 kg (180 lb)  Height:  6\' 3"  (1.905 m)    PROCEDURES   DENTAL BLOCK 1.8 milliliters of 0.5% bupivacaine with epinephrine were injected into the buccal fold adjacent to the left lower second molar. The patient tolerated this well and there were no immediate complications. Adequate analgesia was obtained.   ED DIAGNOSES     ICD-10-CM   1. Pain due to dental caries K02.9   2. Dental decay K02.9        Jesslynn Kruck,  MD 09/11/17 0301    Paula LibraMolpus, Jenesis Suchy, MD 09/11/17 [phone]

## 2018-04-25 ENCOUNTER — Emergency Department (HOSPITAL_BASED_OUTPATIENT_CLINIC_OR_DEPARTMENT_OTHER)
Admission: EM | Admit: 2018-04-25 | Discharge: 2018-04-25 | Disposition: A | Discharge status: Home / Self Care | Payer: No Typology Code available for payment source | Attending: Physician Assistant | Admitting: Physician Assistant

## 2018-04-25 ENCOUNTER — Other Ambulatory Visit: Payer: Self-pay

## 2018-04-25 ENCOUNTER — Encounter (HOSPITAL_BASED_OUTPATIENT_CLINIC_OR_DEPARTMENT_OTHER): Payer: Self-pay | Admitting: Emergency Medicine

## 2018-04-25 DIAGNOSIS — F17210 Nicotine dependence, cigarettes, uncomplicated: Secondary | ICD-10-CM | POA: Insufficient documentation

## 2018-04-25 DIAGNOSIS — S81812A Laceration without foreign body, left lower leg, initial encounter: Secondary | ICD-10-CM | POA: Diagnosis not present

## 2018-04-25 DIAGNOSIS — Y9389 Activity, other specified: Secondary | ICD-10-CM | POA: Diagnosis not present

## 2018-04-25 DIAGNOSIS — Z23 Encounter for immunization: Secondary | ICD-10-CM | POA: Diagnosis not present

## 2018-04-25 DIAGNOSIS — W269XXA Contact with unspecified sharp object(s), initial encounter: Secondary | ICD-10-CM | POA: Insufficient documentation

## 2018-04-25 DIAGNOSIS — Y999 Unspecified external cause status: Secondary | ICD-10-CM | POA: Diagnosis not present

## 2018-04-25 DIAGNOSIS — Y929 Unspecified place or not applicable: Secondary | ICD-10-CM | POA: Diagnosis not present

## 2018-04-25 MED ORDER — CEPHALEXIN 500 MG PO CAPS: 500.0000 mg | ORAL_CAPSULE | Freq: Three times a day (TID) | ORAL | 0 refills | Status: AC

## 2018-04-25 MED ORDER — TETANUS-DIPHTH-ACELL PERTUSSIS 5-2.5-18.5 LF-MCG/0.5 IM SUSP
0.5000 mL | Freq: Once | INTRAMUSCULAR | Status: AC
  Administered 2018-04-25: 0.5 mL via INTRAMUSCULAR
  Filled 2018-04-25: qty 0.5
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
## 2018-04-25 NOTE — ED Notes (Signed)
Left knee lac irrigated w/ sterile saline.
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
## 2018-04-25 NOTE — Discharge Instructions (Signed)
It was my pleasure taking care of you today!   Please take all of your antibiotics until finished!  Keep wound clean and dry. Wash multiple times with soap and water.   Return to ER for new or worsening symptoms, any additional concerns.

## 2018-08-28 ENCOUNTER — Emergency Department (HOSPITAL_BASED_OUTPATIENT_CLINIC_OR_DEPARTMENT_OTHER)
Admission: EM | Admit: 2018-08-28 | Discharge: 2018-08-28 | Disposition: A | Discharge status: Home / Self Care | Payer: Self-pay | Attending: Emergency Medicine | Admitting: Emergency Medicine

## 2018-08-28 ENCOUNTER — Emergency Department (HOSPITAL_BASED_OUTPATIENT_CLINIC_OR_DEPARTMENT_OTHER): Payer: Self-pay

## 2018-08-28 ENCOUNTER — Encounter (HOSPITAL_BASED_OUTPATIENT_CLINIC_OR_DEPARTMENT_OTHER): Payer: Self-pay | Admitting: Emergency Medicine

## 2018-08-28 ENCOUNTER — Other Ambulatory Visit: Payer: Self-pay

## 2018-08-28 DIAGNOSIS — R079 Chest pain, unspecified: Secondary | ICD-10-CM | POA: Insufficient documentation

## 2018-08-28 DIAGNOSIS — Z5321 Procedure and treatment not carried out due to patient leaving prior to being seen by health care provider: Secondary | ICD-10-CM | POA: Insufficient documentation

## 2018-08-28 LAB — BASIC METABOLIC PANEL
Anion gap: 9 (ref 5–15)
BUN: 14 mg/dL (ref 6–20)
CO2: 27 mmol/L (ref 22–32)
Calcium: 9.3 mg/dL (ref 8.9–10.3)
Chloride: 101 mmol/L (ref 98–111)
Creatinine, Ser: 0.91 mg/dL (ref 0.61–1.24)
GFR calc Af Amer: >60 mL/min (ref >60)
GLUCOSE: 97 mg/dL (ref 70–99)
Potassium: 4.1 mmol/L (ref 3.5–5.1)
Sodium: 137 mmol/L (ref 135–145)

## 2018-08-28 LAB — CBC
HCT: 45.8 % (ref 39.0–52.0)
Hemoglobin: 15.1 g/dL (ref 13.0–17.0)
MCH: 31 pg (ref 26.0–34.0)
MCHC: 33 g/dL (ref 30.0–36.0)
MCV: 94 fL (ref 80.0–100.0)
NRBC: 0 % (ref 0.0–0.2)
Platelets: 256 10*3/uL (ref 150–400)
RBC: 4.87 MIL/uL (ref 4.22–5.81)
RDW: 12.2 % (ref 11.5–15.5)
WBC: 7.2 10*3/uL (ref 4.0–10.5)

## 2018-08-28 LAB — TROPONIN I

## 2018-08-28 NOTE — ED Triage Notes (Signed)
Intermittent chest pain x 2 weeks. States today he feels "weird" and has been sweating.

## 2018-08-28 NOTE — ED Notes (Signed)
Pt called x 2. Not found in either lobby or outside ED

## 2018-08-28 NOTE — ED Notes (Signed)
Called for room no answer

## 2018-08-29 ENCOUNTER — Other Ambulatory Visit: Payer: Self-pay

## 2018-08-29 ENCOUNTER — Emergency Department (HOSPITAL_BASED_OUTPATIENT_CLINIC_OR_DEPARTMENT_OTHER): Payer: Self-pay

## 2018-08-29 ENCOUNTER — Encounter (HOSPITAL_BASED_OUTPATIENT_CLINIC_OR_DEPARTMENT_OTHER): Payer: Self-pay | Admitting: Emergency Medicine

## 2018-08-29 ENCOUNTER — Emergency Department (HOSPITAL_BASED_OUTPATIENT_CLINIC_OR_DEPARTMENT_OTHER)
Admission: EM | Admit: 2018-08-29 | Discharge: 2018-08-29 | Disposition: A | Discharge status: Home / Self Care | Payer: Self-pay | Attending: Emergency Medicine | Admitting: Emergency Medicine

## 2018-08-29 DIAGNOSIS — Z79899 Other long term (current) drug therapy: Secondary | ICD-10-CM | POA: Insufficient documentation

## 2018-08-29 DIAGNOSIS — R0789 Other chest pain: Secondary | ICD-10-CM | POA: Insufficient documentation

## 2018-08-29 DIAGNOSIS — F17210 Nicotine dependence, cigarettes, uncomplicated: Secondary | ICD-10-CM | POA: Insufficient documentation

## 2018-08-29 LAB — TROPONIN I

## 2018-08-29 NOTE — ED Triage Notes (Addendum)
Reports chest pain for 2 weeks when bending over.  States this pain has now become constant.  Here yesterday for same.  Left AMA after triage.

## 2018-08-29 NOTE — ED Provider Notes (Signed)
[facility] EMERGENCY DEPARTMENT Provider Note   CSN: [id number] Arrival date & time: 08/29/18  0803     History   Chief Complaint Chief Complaint  Patient presents with  . Chest Pain    HPI Nicholas Kaiser is a 27 y.o. male.  27 year old male with history of reflux who presents with chest pain.  Patient states that he has had several weeks of migratory chest pain that was initially intermittent but is now constant.  Pain has been constant since this morning.  He has noticed that it is worse when he bends over as well as when he moves in certain ways.  It is not associated with exertion.  He denies any associated shortness of breath, nausea, vomiting, or leg swelling.  He has had some intermittent sweatiness of his palms.  He denies any recent travel, history of blood clots, or history of cancer.  He smokes at least one pack per day, several beers per day, no illicit drug use.  No change in physical activity recently.  Of note, he checked into the ED last night but left before being seen.  FH negative for heart disease.  The history is provided by the patient.  Chest Pain      Past Medical History:  Diagnosis Date  . Reflux     There are no active problems to display for this patient.   History reviewed. No pertinent surgical history.      Home Medications    Prior to Admission medications   Medication Sig Start Date End Date Taking? Authorizing Provider  cephALEXin (KEFLEX) 500 MG capsule Take 1 capsule (500 mg total) by mouth 3 (three) times daily. 04/25/18   Ward, Chase Picket, PA-C  HYDROcodone-acetaminophen (NORCO) 5-325 MG tablet Take 1 tablet by mouth every 4 (four) hours as needed for severe pain. 09/11/17   Molpus, Jonny Ruiz, MD    Family History History reviewed. No pertinent family history.  Social History Social History   Tobacco Use  . Smoking status: Current Every Day Smoker    Packs/day: 1.00    Types: Cigarettes  . Smokeless tobacco:  Never Used  Substance Use Topics  . Alcohol use: Yes    Comment: socially  . Drug use: Yes    Types: Marijuana    Comment: denies current use     Allergies   Patient has no known allergies.   Review of Systems Review of Systems  Cardiovascular: Positive for chest pain.   All other systems reviewed and are negative except that which was mentioned in HPI   Physical Exam Updated Vital Signs BP 140/87 (BP Location: Left Arm)   Pulse 64   Temp 97.8 F (36.6 C) (Oral)   Resp 20   Ht 6\' 3"  (1.905 m)   Wt 81.6 kg   SpO2 98%   BMI 22.50 kg/m   Physical Exam  Constitutional: He is oriented to person, place, and time. He appears well-developed and well-nourished. No distress.  HENT:  Head: Normocephalic and atraumatic.  Moist mucous membranes  Eyes: Pupils are equal, round, and reactive to light. Conjunctivae are normal.  Neck: Neck supple.  Cardiovascular: Normal rate, regular rhythm and normal heart sounds.  No murmur heard. Pulmonary/Chest: Effort normal and breath sounds normal.  Abdominal: Soft. Bowel sounds are normal. He exhibits no distension. There is no tenderness.  Musculoskeletal: He exhibits no edema.  No chest wall tenderness  Neurological: He is alert and oriented to person, place, and time.  Fluent speech  Skin: Skin is warm and dry.  Psychiatric: He has a normal mood and affect. Judgment normal.  Nursing note and vitals reviewed.    ED Treatments / Results  Labs (all labs ordered are listed, but only abnormal results are displayed) Labs Reviewed  TROPONIN I    EKG None  Sinus rhythm, rate 67, no ST segment or T wave changes, no evidence of ischemia.  Radiology Dg Chest 2 View  Result Date: 08/28/2018 CLINICAL DATA:  Chest pain. EXAM: CHEST - 2 VIEW COMPARISON:  Radiographs of Mar 28, 2017. FINDINGS: The heart size and mediastinal contours are within normal limits. Both lungs are clear. No pneumothorax or pleural effusion is noted. The  visualized skeletal structures are unremarkable. IMPRESSION: No active cardiopulmonary disease. Electronically Signed   By: Lupita Raider, M.D.   On: 08/28/2018 15:28    Procedures Procedures (including critical care time)  Medications Ordered in ED Medications - No data to display   Initial Impression / Assessment and Plan / ED Course  I have reviewed the triage vital signs and the nursing notes.  Pertinent labs & imaging results that were available during my care of the patient were reviewed by me and considered in my medical decision making (see chart for details).     EKG unremarkable.  I reviewed basic lab work and troponin from last night which were unremarkable.  Chest x-ray yesterday was normal.  Obtained second troponin. PERC negative and VS normal therefore doubt PE. HEART score is 1.  Symptoms are very apical for ACS and suggest musculoskeletal etiology given that pain is worse with certain movements.  Discussed supportive measures including scheduled NSAIDs and Tylenol.  Return precautions reviewed.  Patient voiced understanding.  Final Clinical Impressions(s) / ED Diagnoses   Final diagnoses:  None    ED Discharge Orders    None       Little, Ambrose Finland, MD 08/29/18 [phone]

## 2018-08-29 NOTE — ED Notes (Signed)
Patient previously had chest xray and labs done yesterday for same complaint. EDP made aware.  Will obtain after EDP evaluates patient.

## 2018-12-02 DIAGNOSIS — R071 Chest pain on breathing: Secondary | ICD-10-CM | POA: Diagnosis not present

## 2018-12-05 ENCOUNTER — Other Ambulatory Visit: Payer: Self-pay

## 2018-12-05 ENCOUNTER — Emergency Department (HOSPITAL_BASED_OUTPATIENT_CLINIC_OR_DEPARTMENT_OTHER): Payer: BLUE CROSS/BLUE SHIELD

## 2018-12-05 ENCOUNTER — Emergency Department (HOSPITAL_BASED_OUTPATIENT_CLINIC_OR_DEPARTMENT_OTHER)
Admission: EM | Admit: 2018-12-05 | Discharge: 2018-12-05 | Disposition: A | Discharge status: Home / Self Care | Payer: BLUE CROSS/BLUE SHIELD | Attending: Emergency Medicine | Admitting: Emergency Medicine

## 2018-12-05 ENCOUNTER — Encounter (HOSPITAL_BASED_OUTPATIENT_CLINIC_OR_DEPARTMENT_OTHER): Payer: Self-pay | Admitting: *Deleted

## 2018-12-05 DIAGNOSIS — M7918 Myalgia, other site: Secondary | ICD-10-CM | POA: Insufficient documentation

## 2018-12-05 DIAGNOSIS — F17210 Nicotine dependence, cigarettes, uncomplicated: Secondary | ICD-10-CM | POA: Insufficient documentation

## 2018-12-05 DIAGNOSIS — R079 Chest pain, unspecified: Secondary | ICD-10-CM | POA: Diagnosis not present

## 2018-12-05 DIAGNOSIS — M791 Myalgia, unspecified site: Secondary | ICD-10-CM

## 2018-12-05 MED ORDER — METHOCARBAMOL 500 MG PO TABS: 500.0000 mg | ORAL_TABLET | Freq: Two times a day (BID) | ORAL | 0 refills | Status: AC

## 2018-12-05 MED ORDER — METHOCARBAMOL 500 MG PO TABS: 500.0000 mg | ORAL_TABLET | Freq: Two times a day (BID) | ORAL | 0 refills | Status: DC

## 2018-12-05 NOTE — Discharge Instructions (Addendum)
Your xray today was normal, please follow up with your primary care physician as scheduled. Please continue to take medication provided at Urgent Care.

## 2018-12-05 NOTE — ED Provider Notes (Signed)
[facility] EMERGENCY DEPARTMENT Provider Note   CSN: [id number] Arrival date & time: 12/05/18  1814     History   Chief Complaint Chief Complaint  Patient presents with  . Muscle Pain    HPI Nicholas Kaiser is a 28 y.o. male.  28 y.o male with a PMH of Reflux presents to the ED with a chief complaint of muscle pain. Patient was seen in the ED in October and had a negative ACS workup. He was also seen at UC two days ago and reports he was diagnosed with costochondritis, described prednisone, diclofenac for his symptoms but reports he is been taking this medication for the past 2 days without relieving symptoms.  He describes his pain as a dull ache on the left side of his chest right in front of his heart.  Reports the pain is not reproducible with palpation.  Denies any illicit drug use such as cocaine, methamphetamines or IV drug use.  He denies any fever, shortness of breath, abdominal pain or other complaints.      Past Medical History:  Diagnosis Date  . Reflux     There are no active problems to display for this patient.   History reviewed. No pertinent surgical history.      Home Medications    Prior to Admission medications   Medication Sig Start Date End Date Taking? Authorizing Provider  Diclofenac Sodium (VOLTAREN PO) Take by mouth.   Yes [provider]  PREDNISONE PO Take by mouth.   Yes [provider]  cephALEXin (KEFLEX) 500 MG capsule Take 1 capsule (500 mg total) by mouth 3 (three) times daily. 04/25/18   Ward, Chase Picket, PA-C  HYDROcodone-acetaminophen (NORCO) 5-325 MG tablet Take 1 tablet by mouth every 4 (four) hours as needed for severe pain. 09/11/17   Molpus, John, MD  methocarbamol (ROBAXIN) 500 MG tablet Take 1 tablet (500 mg total) by mouth 2 (two) times daily for 7 days. 12/05/18 12/12/18  Claude Manges, PA-C    Family History No family history on file.  Social History Social History   Tobacco Use  . Smoking  status: Current Every Day Smoker    Packs/day: 1.00    Types: Cigarettes  . Smokeless tobacco: Never Used  Substance Use Topics  . Alcohol use: Yes    Comment: socially  . Drug use: Yes    Types: Marijuana    Comment: denies current use     Allergies   Patient has no known allergies.   Review of Systems Review of Systems  Constitutional: Negative for fever.  Cardiovascular: Positive for chest pain.  Musculoskeletal: Positive for myalgias.     Physical Exam Updated Vital Signs BP (!) 145/103   Pulse 91   Temp 98.4 F (36.9 C) (Oral)   Resp 18   Ht 6\' 3"  (1.905 m)   Wt 84.4 kg   SpO2 99%   BMI 23.25 kg/m   Physical Exam Vitals signs and nursing note reviewed.  Constitutional:      Appearance: He is well-developed.     Comments: Overall well-appearing.  HENT:     Head: Normocephalic and atraumatic.  Eyes:     General: No scleral icterus.    Pupils: Pupils are equal, round, and reactive to light.  Neck:     Musculoskeletal: Normal range of motion.  Cardiovascular:     Heart sounds: Normal heart sounds.  Pulmonary:     Effort: Pulmonary effort is normal.  Breath sounds: Normal breath sounds. No wheezing or rhonchi.     Comments: Breath sounds diminished to auscultation. Chest:     Chest wall: No tenderness.     Breasts:        Left: No inverted nipple, mass, nipple discharge, skin change or tenderness.       Comments: describes pain along the left chest. Abdominal:     General: Bowel sounds are normal. There is no distension.     Palpations: Abdomen is soft.     Tenderness: There is no abdominal tenderness.  Musculoskeletal:        General: No tenderness or deformity.  Skin:    General: Skin is warm and dry.  Neurological:     Mental Status: He is alert and oriented to person, place, and time.      ED Treatments / Results  Labs (all labs ordered are listed, but only abnormal results are displayed) Labs Reviewed - No data to  display  EKG None  Radiology Dg Chest 2 View  Result Date: 12/05/2018 CLINICAL DATA:  28 year old male with a history of left-sided chest pain EXAM: CHEST - 2 VIEW COMPARISON:  08/28/2018, 03/28/2017 FINDINGS: The heart size and mediastinal contours are within normal limits. Both lungs are clear. No acute displaced fracture. Mild scoliotic curvature. IMPRESSION: Negative for acute cardiopulmonary disease Electronically Signed   By: Gilmer Mor D.O.   On: 12/05/2018 20:14    Procedures Procedures (including critical care time)  Medications Ordered in ED Medications - No data to display   Initial Impression / Assessment and Plan / ED Course  I have reviewed the triage vital signs and the nursing notes.  Pertinent labs & imaging results that were available during my care of the patient were reviewed by me and considered in my medical decision making (see chart for details).    Patient presents with recurrent chest pain.  In the ED in the month of October with a negative cardiac work-up.  Patient was seen at urgent care 2 days ago and diagnosed with costochondritis, he was placed on prednisone along with diclofenac and reports medication is not helping with symptoms.  Evaluation patient's pain is nonreproducible with palpation, he reports the pain is right in front of heart region.  Denies any illicit drug use, IV drug use, or cocaine.   DG chest xray obtained to r/o any cardiopulmonary disease, negative for this. He reports it is not reproducible with palpation.  At this time patient has had a negative cardiac workup recently he has a scheduled appointment with a PCP on 02.12. Will add muscle relaxer to help with his pain and have him follow up outpatient. No further emergency workup noted. Patient understand and agrees with plan. Return precautions provided at length.   Final Clinical Impressions(s) / ED Diagnoses   Final diagnoses:  Muscle pain    ED Discharge Orders          Ordered    methocarbamol (ROBAXIN) 500 MG tablet  2 times daily     12/05/18 2033           Claude Manges, PA-C 12/05/18 2035    Virgina Norfolk, DO 12/06/18 0100

## 2018-12-05 NOTE — ED Triage Notes (Signed)
Chest pain for a few months states he has been seen 2 times for the pain and has negative test. Back pain. He was seen at [facility] and diagnosed with costochondritis.

## 2018-12-06 MED FILL — METHOCARBAMOL 500 MG TABLET: 500 | 7 days supply | Qty: 14 | Fill #0

## 2018-12-21 DIAGNOSIS — Z6823 Body mass index (BMI) 23.0-23.9, adult: Secondary | ICD-10-CM | POA: Diagnosis not present

## 2018-12-21 DIAGNOSIS — M545 Low back pain: Secondary | ICD-10-CM | POA: Diagnosis not present

## 2018-12-21 DIAGNOSIS — K219 Gastro-esophageal reflux disease without esophagitis: Secondary | ICD-10-CM | POA: Diagnosis not present

## 2019-01-12 DIAGNOSIS — K219 Gastro-esophageal reflux disease without esophagitis: Secondary | ICD-10-CM | POA: Diagnosis not present

## 2019-03-27 DIAGNOSIS — M5416 Radiculopathy, lumbar region: Secondary | ICD-10-CM | POA: Diagnosis not present

## 2019-03-27 DIAGNOSIS — M5137 Other intervertebral disc degeneration, lumbosacral region: Secondary | ICD-10-CM | POA: Diagnosis not present

## 2019-03-27 DIAGNOSIS — M5417 Radiculopathy, lumbosacral region: Secondary | ICD-10-CM | POA: Diagnosis not present

## 2019-03-27 DIAGNOSIS — M9903 Segmental and somatic dysfunction of lumbar region: Secondary | ICD-10-CM | POA: Diagnosis not present

## 2019-03-28 DIAGNOSIS — M5417 Radiculopathy, lumbosacral region: Secondary | ICD-10-CM | POA: Diagnosis not present

## 2019-03-28 DIAGNOSIS — M5137 Other intervertebral disc degeneration, lumbosacral region: Secondary | ICD-10-CM | POA: Diagnosis not present

## 2019-03-28 DIAGNOSIS — M5416 Radiculopathy, lumbar region: Secondary | ICD-10-CM | POA: Diagnosis not present

## 2019-03-28 DIAGNOSIS — M9903 Segmental and somatic dysfunction of lumbar region: Secondary | ICD-10-CM | POA: Diagnosis not present

## 2019-03-30 DIAGNOSIS — M5416 Radiculopathy, lumbar region: Secondary | ICD-10-CM | POA: Diagnosis not present

## 2019-03-30 DIAGNOSIS — M5417 Radiculopathy, lumbosacral region: Secondary | ICD-10-CM | POA: Diagnosis not present

## 2019-03-30 DIAGNOSIS — M9903 Segmental and somatic dysfunction of lumbar region: Secondary | ICD-10-CM | POA: Diagnosis not present

## 2019-03-30 DIAGNOSIS — M5137 Other intervertebral disc degeneration, lumbosacral region: Secondary | ICD-10-CM | POA: Diagnosis not present

## 2019-04-10 DIAGNOSIS — M5137 Other intervertebral disc degeneration, lumbosacral region: Secondary | ICD-10-CM | POA: Diagnosis not present

## 2019-04-10 DIAGNOSIS — M5416 Radiculopathy, lumbar region: Secondary | ICD-10-CM | POA: Diagnosis not present

## 2019-04-10 DIAGNOSIS — M5417 Radiculopathy, lumbosacral region: Secondary | ICD-10-CM | POA: Diagnosis not present

## 2019-04-10 DIAGNOSIS — M9903 Segmental and somatic dysfunction of lumbar region: Secondary | ICD-10-CM | POA: Diagnosis not present

## 2019-04-11 DIAGNOSIS — M5137 Other intervertebral disc degeneration, lumbosacral region: Secondary | ICD-10-CM | POA: Diagnosis not present

## 2019-04-11 DIAGNOSIS — M5416 Radiculopathy, lumbar region: Secondary | ICD-10-CM | POA: Diagnosis not present

## 2019-04-11 DIAGNOSIS — M9903 Segmental and somatic dysfunction of lumbar region: Secondary | ICD-10-CM | POA: Diagnosis not present

## 2019-04-11 DIAGNOSIS — M5417 Radiculopathy, lumbosacral region: Secondary | ICD-10-CM | POA: Diagnosis not present

## 2019-10-12 DIAGNOSIS — R05 Cough: Secondary | ICD-10-CM | POA: Diagnosis not present

## 2019-10-12 DIAGNOSIS — Z20828 Contact with and (suspected) exposure to other viral communicable diseases: Secondary | ICD-10-CM | POA: Diagnosis not present

## 2020-06-29 IMAGING — DX DG CHEST 2V
2 series · 2 of 2 positions shown · non-contrast
Comparison: 08/28/2018, 03/28/2017

CLINICAL DATA: 27-year-old male with a history of left-sided chest
pain

EXAM:
CHEST - 2 VIEW

[chest pa]
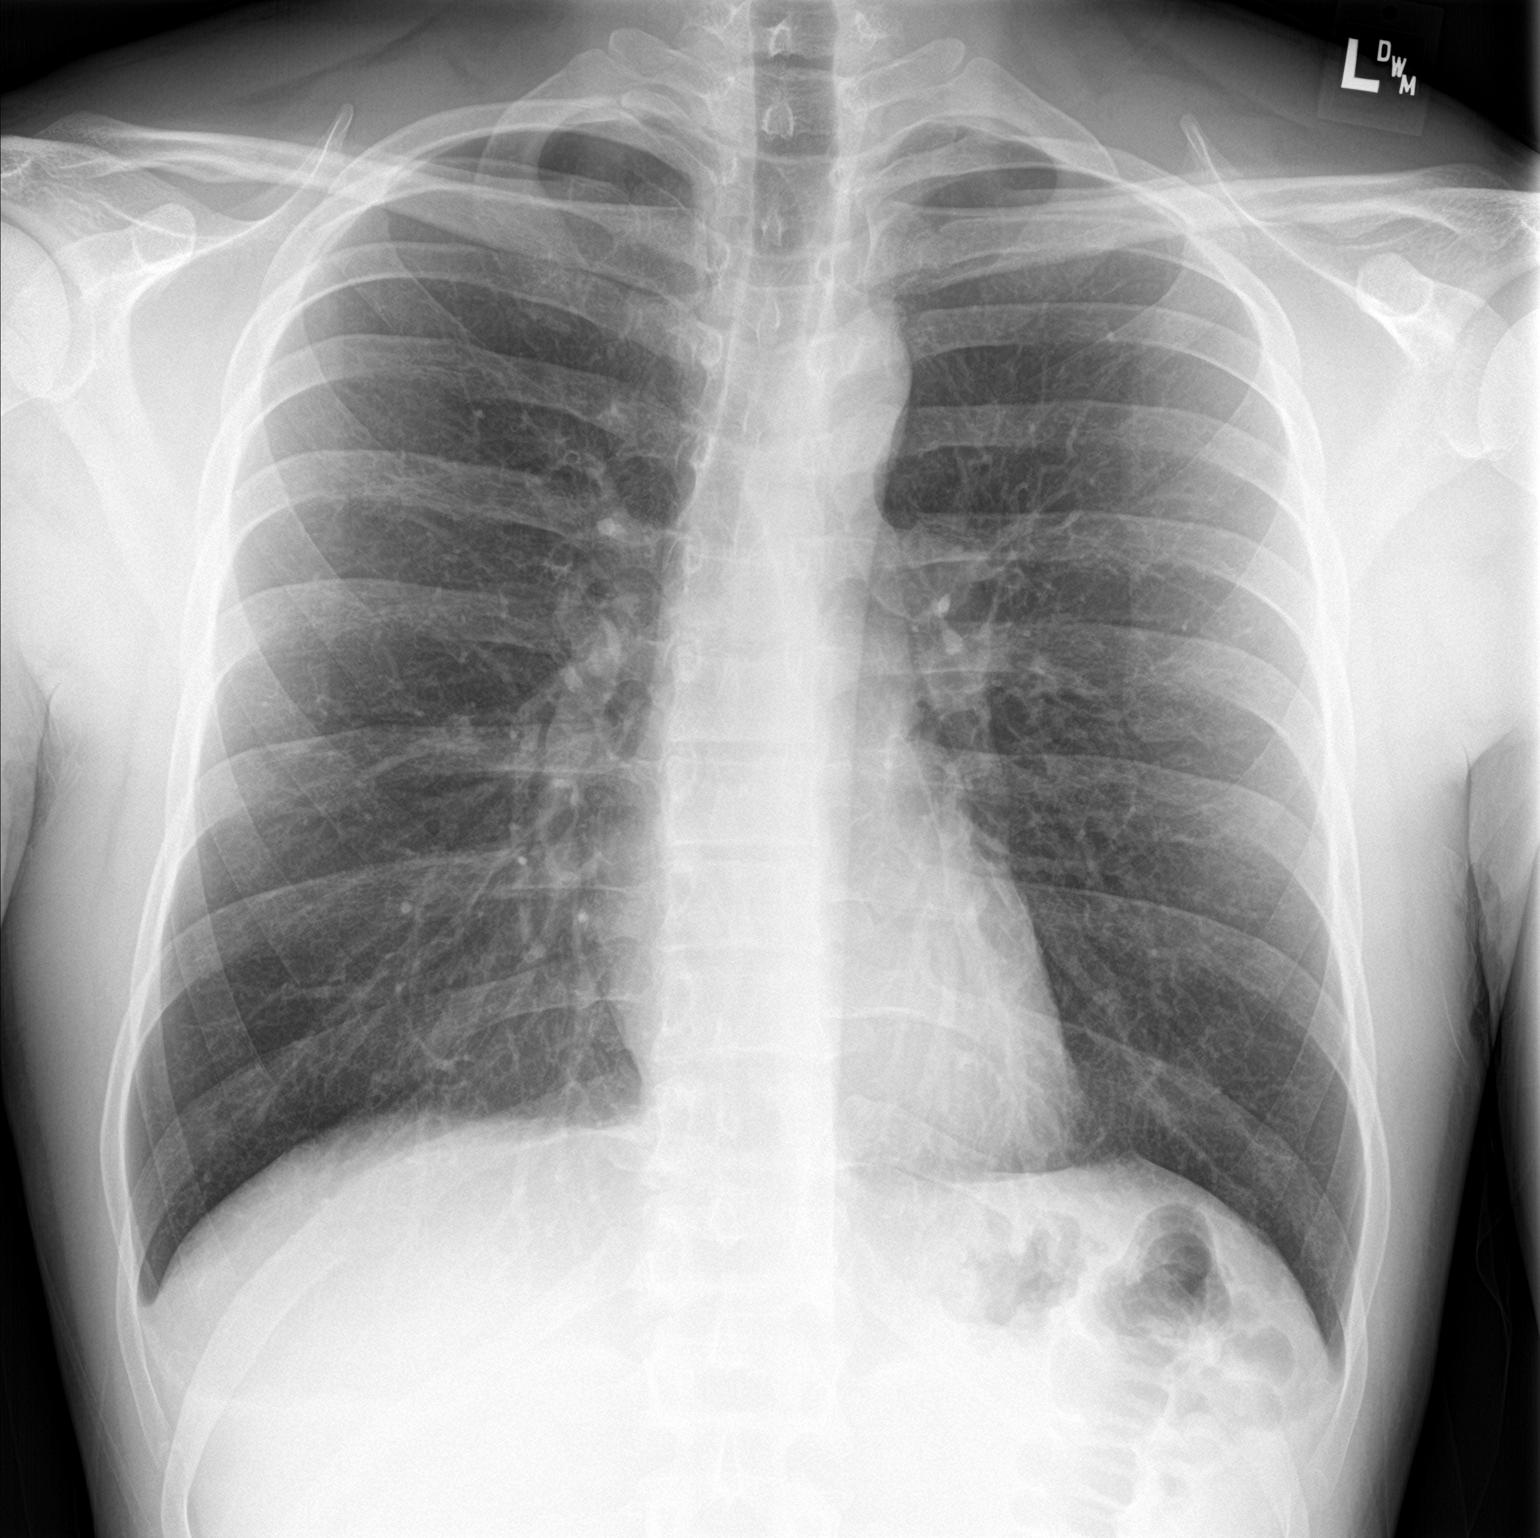

[chest lat]
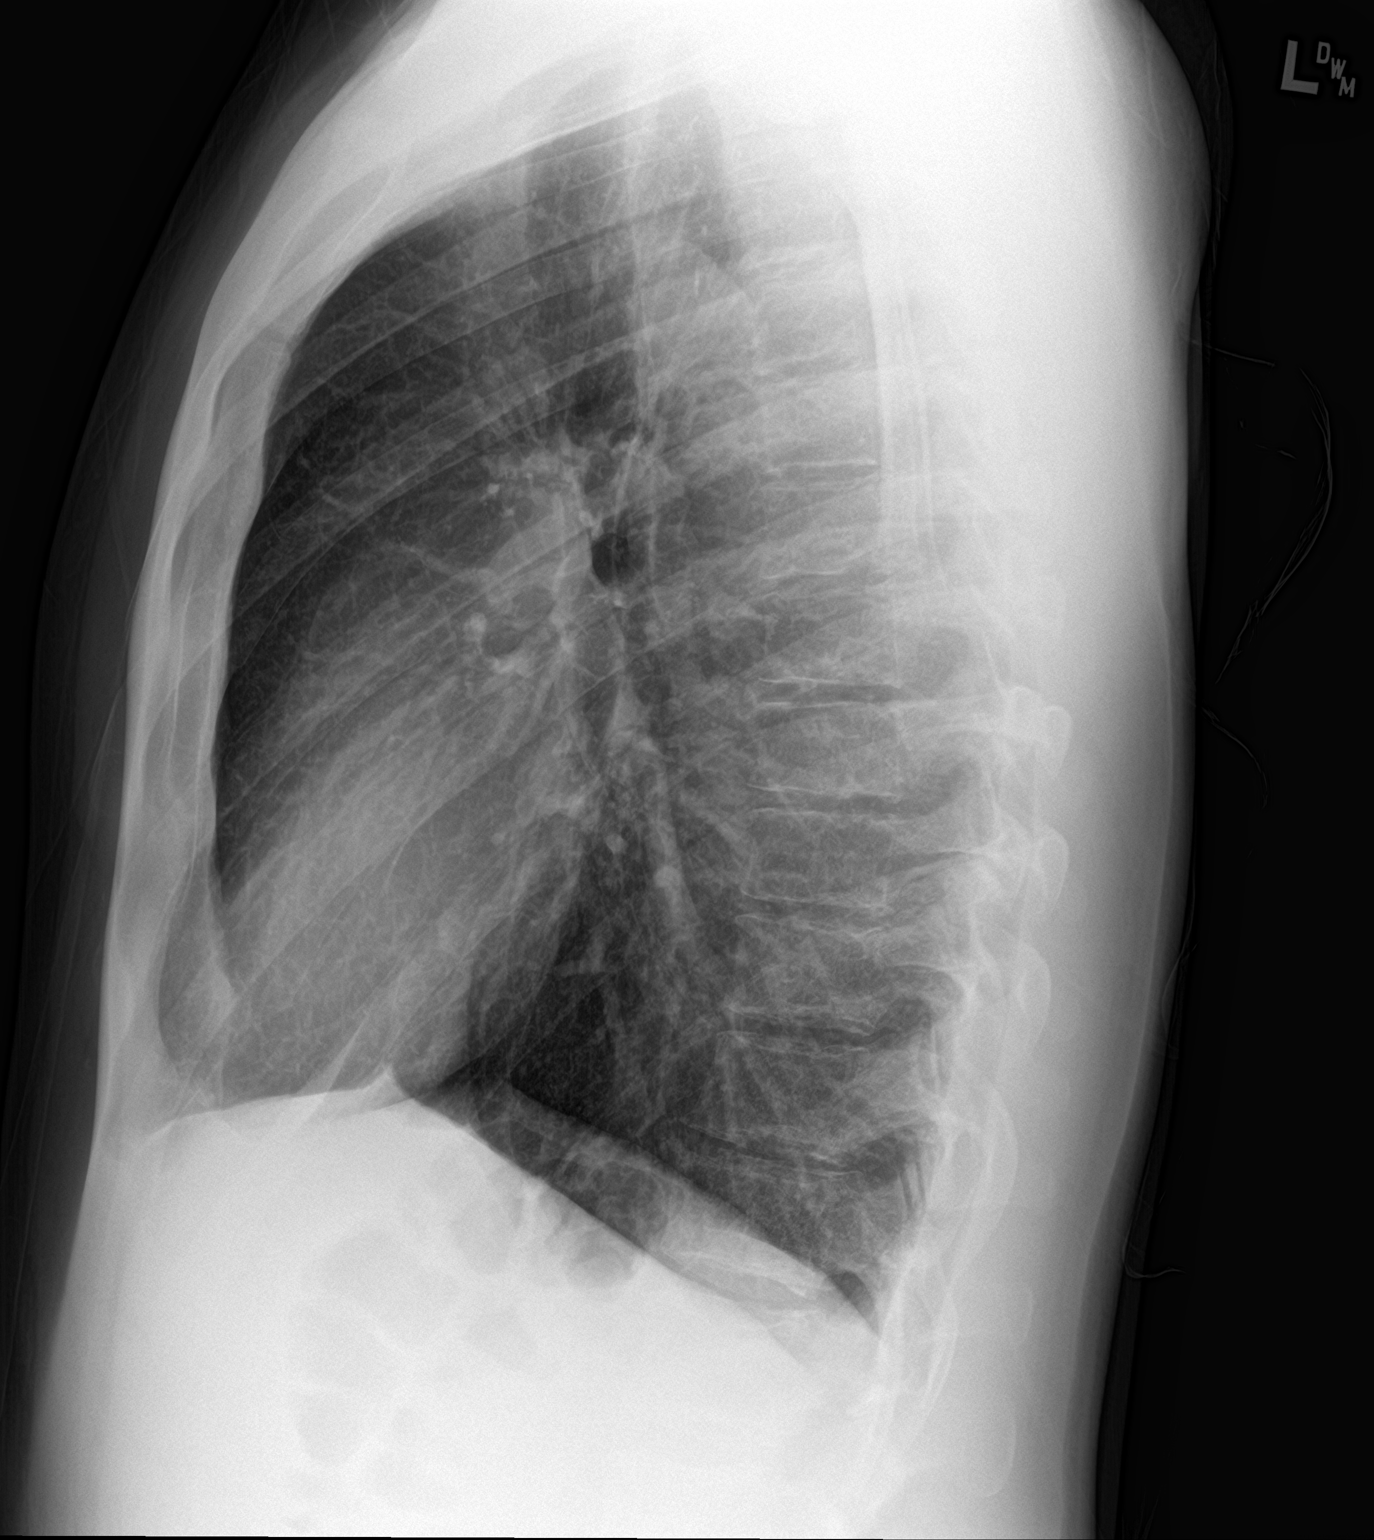

[2 of 2 positions shown; findings below may reference images not displayed]

FINDINGS: The heart size and mediastinal contours are within normal limits.
Both lungs are clear. No acute displaced fracture. Mild scoliotic
curvature.
IMPRESSION: Negative for acute cardiopulmonary disease

## 2021-12-09 ENCOUNTER — Emergency Department (HOSPITAL_BASED_OUTPATIENT_CLINIC_OR_DEPARTMENT_OTHER)
Admission: EM | Admit: 2021-12-09 | Discharge: 2021-12-09 | Disposition: A | Discharge status: Home / Self Care | Payer: Self-pay | Attending: Emergency Medicine | Admitting: Emergency Medicine

## 2021-12-09 ENCOUNTER — Encounter (HOSPITAL_BASED_OUTPATIENT_CLINIC_OR_DEPARTMENT_OTHER): Payer: Self-pay

## 2021-12-09 ENCOUNTER — Emergency Department (HOSPITAL_BASED_OUTPATIENT_CLINIC_OR_DEPARTMENT_OTHER): Payer: Self-pay

## 2021-12-09 ENCOUNTER — Other Ambulatory Visit: Payer: Self-pay

## 2021-12-09 DIAGNOSIS — R079 Chest pain, unspecified: Secondary | ICD-10-CM

## 2021-12-09 DIAGNOSIS — R0602 Shortness of breath: Secondary | ICD-10-CM | POA: Insufficient documentation

## 2021-12-09 DIAGNOSIS — F17210 Nicotine dependence, cigarettes, uncomplicated: Secondary | ICD-10-CM | POA: Insufficient documentation

## 2021-12-09 DIAGNOSIS — Z720 Tobacco use: Secondary | ICD-10-CM

## 2021-12-09 LAB — BASIC METABOLIC PANEL
Anion gap: 8 (ref 5–15)
BUN: 14 mg/dL (ref 6–20)
CO2: 24 mmol/L (ref 22–32)
Calcium: 9.5 mg/dL (ref 8.9–10.3)
Chloride: 105 mmol/L (ref 98–111)
Creatinine, Ser: 0.85 mg/dL (ref 0.61–1.24)
GFR, Estimated: >60 mL/min (ref >60)
Glucose, Bld: 111 mg/dL — ABNORMAL HIGH (ref 70–99)
Potassium: 3.8 mmol/L (ref 3.5–5.1)
Sodium: 137 mmol/L (ref 135–145)

## 2021-12-09 LAB — CBC
HCT: 43.4 % (ref 39.0–52.0)
Hemoglobin: 15.1 g/dL (ref 13.0–17.0)
MCH: 30.4 pg (ref 26.0–34.0)
MCHC: 34.8 g/dL (ref 30.0–36.0)
MCV: 87.3 fL (ref 80.0–100.0)
Platelets: 219 10*3/uL (ref 150–400)
RBC: 4.97 MIL/uL (ref 4.22–5.81)
RDW: 12.2 % (ref 11.5–15.5)
WBC: 7.4 10*3/uL (ref 4.0–10.5)
nRBC: 0 % (ref 0.0–0.2)

## 2021-12-09 LAB — TROPONIN I (HIGH SENSITIVITY)
Troponin I (High Sensitivity): <2 ng/L (ref <18)
Troponin I (High Sensitivity): <2 ng/L (ref <18)

## 2021-12-09 MED ORDER — PANTOPRAZOLE SODIUM 20 MG PO TBEC: 20.0000 mg | DELAYED_RELEASE_TABLET | Freq: Every day | ORAL | 0 refills | Status: AC

## 2021-12-09 NOTE — ED Triage Notes (Signed)
Pt c/o PC x 2-3 months-"tingling" to bilat UE and left LE-NAD-steady gait

## 2021-12-09 NOTE — ED Provider Notes (Signed)
[facility] EMERGENCY DEPARTMENT Provider Note   CSN: [id number] Arrival date & time: 12/09/21  1614     History  Chief Complaint  Patient presents with   Chest Pain    Nicholas Kaiser is a 31 y.o. male 31 year old male with past medical history significant for significant tobacco use, patient reports that he was smoking 2 packs/day regularly, recently decreased to about 1 pack every other day who presents with ongoing chest pain.  Patient reports that chest pain has been happening for around 2 months, radiates from right side to left side, and centrally, seems to vary day-to-day and moment to moment.  Does not associate with exertion.  Patient denies history of high blood pressure, high cholesterol, previous stroke, previous ACS, first-degree family history of ACS, diabetes.  Patient has been seen and evaluated for this problem previously, was told that it may have to do with lung inflammation, has been taking some anti-inflammatories.  Patient also endorses history of acid reflux, reports that he does not take any proton pump inhibitor chronically, mostly takes Tums as needed.  Patient not experiencing chest pain on my evaluation but did report chest pain just prior to my entering the room.  Patient denies any history of depression, anxiety, but does report some hand tingling, hand sweating associated with worrying over chest pain.  Patient appears noticeably anxious on my exam.   Chest Pain Associated symptoms: shortness of breath       Home Medications Prior to Admission medications   Medication Sig Start Date End Date Taking? Authorizing Provider  pantoprazole (PROTONIX) 20 MG tablet Take 1 tablet (20 mg total) by mouth daily. 12/09/21  Yes Willa Brocks H, PA-C  cephALEXin (KEFLEX) 500 MG capsule Take 1 capsule (500 mg total) by mouth 3 (three) times daily. 04/25/18   Ward, Ozella Almond, PA-C  Diclofenac Sodium (VOLTAREN PO) Take by mouth.    [provider]   HYDROcodone-acetaminophen (NORCO) 5-325 MG tablet Take 1 tablet by mouth every 4 (four) hours as needed for severe pain. 09/11/17   Molpus, John, MD  PREDNISONE PO Take by mouth.    [provider]      Allergies    Patient has no known allergies.    Review of Systems   Review of Systems  Respiratory:  Positive for shortness of breath.   Cardiovascular:  Positive for chest pain.  All other systems reviewed and are negative.  Physical Exam Updated Vital Signs BP 128/88 (BP Location: Right Arm)    Pulse 60    Temp 98.2 F (36.8 C) (Oral)    Resp 18    Ht 6\' 3"  (1.905 m)    Wt 83.9 kg    SpO2 98%    BMI 23.12 kg/m  Physical Exam Vitals and nursing note reviewed.  Constitutional:      General: He is not in acute distress.    Appearance: Normal appearance.  HENT:     Head: Normocephalic and atraumatic.  Eyes:     General:        Right eye: No discharge.        Left eye: No discharge.  Cardiovascular:     Rate and Rhythm: Normal rate and regular rhythm.     Heart sounds: No murmur heard.   No friction rub. No gallop.  Pulmonary:     Effort: Pulmonary effort is normal.     Breath sounds: Normal breath sounds.  Abdominal:     General: Bowel  sounds are normal.     Palpations: Abdomen is soft.  Skin:    General: Skin is warm and dry.     Capillary Refill: Capillary refill takes less than 2 seconds.  Neurological:     Mental Status: He is alert and oriented to person, place, and time.  Psychiatric:        Mood and Affect: Mood normal.        Behavior: Behavior normal.    ED Results / Procedures / Treatments   Labs (all labs ordered are listed, but only abnormal results are displayed) Labs Reviewed  BASIC METABOLIC PANEL - Abnormal; Notable for the following components:      Result Value   Glucose, Bld 111 (*)    All other components within normal limits  CBC  TROPONIN I (HIGH SENSITIVITY)  TROPONIN I (HIGH SENSITIVITY)    EKG EKG  Interpretation  Date/Time:  Tuesday December 09 2021 16:24:04 EST Ventricular Rate:  86 PR Interval:  184 QRS Duration: 102 QT Interval:  346 QTC Calculation: 414 R Axis:   82 Text Interpretation: Normal sinus rhythm Normal ECG  nonspecific ST/T segments Confirmed by Sherwood Gambler [phone]) on 12/09/2021 5:01:15 PM  Radiology DG Chest 2 View  Result Date: 12/09/2021 CLINICAL DATA:  Chest pain for 2-3 months EXAM: CHEST - 2 VIEW COMPARISON:  12/05/2018 FINDINGS: The heart size and mediastinal contours are within normal limits. Both lungs are clear. The visualized skeletal structures are unremarkable. IMPRESSION: No active cardiopulmonary disease. Electronically Signed   By: Randa Ngo M.D.   On: 12/09/2021 16:40    Procedures Procedures    Medications Ordered in ED Medications - No data to display  ED Course/ Medical Decision Making/ A&P                            Medical Decision Making Amount and/or Complexity of Data Reviewed Labs: ordered. Radiology: ordered.  Risk Prescription drug management.   Given the large differential diagnosis for Nicholas Kaiser, the decision making in this case is of high complexity.  After evaluating all of the data points in this case, the presentation of Nicholas Kaiser is NOT consistent with Acute Coronary Syndrome (ACS) and/or myocardial ischemia, pulmonary embolism, aortic dissection; Borhaave's, significant arrythmia, pneumothorax, cardiac tamponade, or other emergent cardiopulmonary condition.  Further, the presentation of Nicholas Kaiser is NOT consistent with pericarditis, myocarditis, cholecystitis, pancreatitis, mediastinitis, endocarditis, new valvular disease.  Additionally, the presentation of Nicholas Kaiser is NOT consistent with flail chest, cardiac contusion, ARDS, or significant intra-thoracic or intra-abdominal bleeding.  Moreover, this presentation is NOT consistent with pneumonia, sepsis, or pyelonephritis.  The  patient has a heart score of 2.  He has negative troponins x2.  BMP is unremarkable other than a very mild hyperglycemia glucose of 111.  CBC is unremarkable.  Independently reviewed and interpreted chest x-ray which shows no acute intrathoracic process.  His EKG showed some nonspecific ST/T changes. My physical exam was remarkable for an anxious male, with heavy tobacco use history who presents with concern of nonspecific chest pain.  Is not worse with exertion, patient has overall low risk factors for cardiac cause of chest pain based on his age and comorbidities.  Discussed with patient that with his very heavy tobacco use I have concern for pleuritic chest pain, pleurisy.  Patient also endorses history of acid reflux, family history of Barrett's esophagus due to severity, patient had been  on PPI in the past but discontinued recently.  Between smoking, acid reflux history, and anxious patient on evaluation there are several other alternative diagnoses for patient's chest pain at this time.  His cardiac work-up has been reassuring, his chest pain is resolved at time of discharge.  Encouraged follow-up with PCP, we will start him on PPI.  Strict return and follow-up precautions have been given by me personally or by detailed written instruction given verbally by nursing staff using the teach back method to the patient/family/caregiver(s).  Data Reviewed/Counseling: I have reviewed the patient's vital signs, nursing notes, and other relevant tests/information. I had a detailed discussion regarding the historical points, exam findings, and any diagnostic results supporting the discharge diagnosis. I also discussed the need for outpatient follow-up and the need to return to the ED if symptoms worsen or if there are any questions or concerns that arise at home.  Final Clinical Impression(s) / ED Diagnoses Final diagnoses:  Chest pain, unspecified type  Tobacco use    Rx / DC Orders ED Discharge Orders           Ordered    pantoprazole (PROTONIX) 20 MG tablet  Daily        12/09/21 1958              Dorien Chihuahua 12/09/21 2008    Goldston, Scott, MD 12/13/21 [phone]

## 2021-12-09 NOTE — Discharge Instructions (Signed)
As we discussed I recommend that you stop smoking, you continue to take some anti-inflammatories to help with what may be chest pain related to your lungs.  Additionally recommend that you start using an acid reflux medication daily to help with your acid reflux symptoms.  I have sent prescription to your pharmacy.  If your chest pain returns, worsens I recommend that you return to the emergency department for further evaluation.

## 2023-02-15 ENCOUNTER — Emergency Department (HOSPITAL_BASED_OUTPATIENT_CLINIC_OR_DEPARTMENT_OTHER)
Admission: EM | Admit: 2023-02-15 | Discharge: 2023-02-15 | Disposition: A | Discharge status: Home / Self Care | Payer: Self-pay | Attending: Emergency Medicine | Admitting: Emergency Medicine

## 2023-02-15 ENCOUNTER — Encounter (HOSPITAL_BASED_OUTPATIENT_CLINIC_OR_DEPARTMENT_OTHER): Payer: Self-pay

## 2023-02-15 DIAGNOSIS — S39012A Strain of muscle, fascia and tendon of lower back, initial encounter: Secondary | ICD-10-CM | POA: Insufficient documentation

## 2023-02-15 DIAGNOSIS — X501XXA Overexertion from prolonged static or awkward postures, initial encounter: Secondary | ICD-10-CM | POA: Insufficient documentation

## 2023-02-15 MED ORDER — METHOCARBAMOL 1000 MG PO TABS
1000.0000 mg | ORAL_TABLET | Freq: Three times a day (TID) | ORAL | 0 refills | Status: AC | PRN
Start: 2023-02-15 — End: ?

## 2023-02-15 MED ORDER — KETOROLAC TROMETHAMINE 30 MG/ML IJ SOLN
30.0000 mg | Freq: Once | INTRAMUSCULAR | Status: AC
  Administered 2023-02-15: 30 mg via INTRAMUSCULAR
  Filled 2023-02-15: qty 1

## 2023-02-15 MED ORDER — METHOCARBAMOL 500 MG PO TABS
1000.0000 mg | ORAL_TABLET | Freq: Once | ORAL | Status: AC
Start: 2023-02-15 — End: 2023-02-15
  Administered 2023-02-15: 1000 mg via ORAL
  Filled 2023-02-15: qty 2

## 2023-02-15 NOTE — Discharge Instructions (Addendum)
Take ibuprofen and Tylenol for pain.  You can alternate these for more diffuse pain coverage.  You can also take the muscle relaxer prescribed, though have caution it can cause dizziness, lightheadedness, sleepiness, or other side effects.  Do not drive, operate heavy machinery, or drink alcohol while on this medication.  You can also apply a heating pad to the area or some topical numbing medicine you can get over-the-counter.  If you develop worsening, recurrent, or continued back pain, numbness or weakness in the legs, incontinence of your bowels or bladders, numbness of your buttocks, fever, abdominal pain, or any other new/concerning symptoms then return to the ER for evaluation.

## 2023-02-15 NOTE — ED Provider Notes (Signed)
[place] [facility] Provider Note   CSN: [id number] Arrival date & time: 02/15/23  7829     History  Chief Complaint  Patient presents with   Back Pain    Nicholas Kaiser is a 32 y.o. male.  HPI 32 year old male presents with low back injury.  He states he was picking up his child who had fallen asleep on the couch and when he bent over to pick up the kid he developed severe low back pain.  It is in his paraspinal low back.  He denies any acute trauma or direct injury.  He states he has had on and off back issues since he was 17.  He denies any radicular symptoms, numbness, weakness.  The pain is most present whenever he stands up and only mild at rest.  No abdominal pain, incontinence, fever.  He took some Motrin last night with some partial help.  He has a hard time walking and does not feel like he can go to work today.  Home Medications Prior to Admission medications   Medication Sig Start Date End Date Taking? Authorizing Provider  cephALEXin (KEFLEX) 500 MG capsule Take 1 capsule (500 mg total) by mouth 3 (three) times daily. 04/25/18   Ward, Chase Picket, PA-C  Diclofenac Sodium (VOLTAREN PO) Take by mouth.    [provider]  HYDROcodone-acetaminophen (NORCO) 5-325 MG tablet Take 1 tablet by mouth every 4 (four) hours as needed for severe pain. 09/11/17   Molpus, John, MD  pantoprazole (PROTONIX) 20 MG tablet Take 1 tablet (20 mg total) by mouth daily. 12/09/21   Prosperi, Christian H, PA-C  PREDNISONE PO Take by mouth.    [provider]      Allergies    Patient has no known allergies.    Review of Systems   Review of Systems  Constitutional:  Negative for fever.  Gastrointestinal:  Negative for abdominal pain.  Genitourinary:        No incontinence  Musculoskeletal:  Positive for back pain.  Neurological:  Negative for weakness and numbness.    Physical Exam Updated Vital Signs BP (!) 143/93   Pulse 68    Temp (!) 97.4 F (36.3 C)   Resp 18   Ht 6\' 3"  (1.905 m)   Wt 86.2 kg   SpO2 98%   BMI 23.75 kg/m  Physical Exam Vitals and nursing note reviewed.  Constitutional:      General: He is not in acute distress.    Appearance: He is well-developed. He is not ill-appearing or diaphoretic.  HENT:     Head: Normocephalic and atraumatic.  Pulmonary:     Effort: Pulmonary effort is normal.  Abdominal:     General: There is no distension.     Palpations: Abdomen is soft.     Tenderness: There is no abdominal tenderness.  Musculoskeletal:     Lumbar back: Tenderness (mild, paraspinal, low lumbar) present. No bony tenderness.  Skin:    General: Skin is warm and dry.  Neurological:     Mental Status: He is alert.     Comments: 5/5 strength in BLE. Grossly normal sensation. He can stand up and walk, but fully straightening when he stands up makes the pain more severe and he has a hard time walking.  However he is able to ambulate.     ED Results / Procedures / Treatments   Labs (all labs ordered are listed, but only abnormal results are  displayed) Labs Reviewed - No data to display  EKG None  Radiology No results found.  Procedures Procedures    Medications Ordered in ED Medications  ketorolac (TORADOL) 30 MG/ML injection 30 mg (has no administration in time range)  methocarbamol (ROBAXIN) tablet 1,000 mg (has no administration in time range)    ED Course/ Medical Decision Making/ A&P                             Medical Decision Making Risk Prescription drug management.   Patient presents with what seems like a lumbar strain.  I have very low suspicion for acute fracture or spinal cord emergency.  Doubt intra-abdominal/retroperitoneal emergency.  Will give IM Toradol and some Robaxin.  His wife dropped him off here.  Otherwise, I do not think acute imaging is needed and I think supportive care is most prudent.  Otherwise, will give work note and a prescription for  Robaxin and have him follow-up with a PCP.  Will give return precautions.        Final Clinical Impression(s) / ED Diagnoses Final diagnoses:  Strain of lumbar region, initial encounter    Rx / DC Orders ED Discharge Orders     None         Pricilla Loveless, MD 02/15/23 [phone]

## 2023-02-15 NOTE — ED Triage Notes (Signed)
Pt reports bending over to pick up child to place in bed. Lower back pain started. Worsened this am. Pain with ambulation. No radiation
